# Patient Record
Sex: Female | Born: 1950 | Race: White | Hispanic: No | Marital: Married | Smoking: Never smoker
Health system: Southern US, Community
[De-identification: ages and names within clinical notes are randomized; demographics above are authoritative.]

## PROBLEM LIST (undated history)

## (undated) DIAGNOSIS — I10 Essential (primary) hypertension: Secondary | ICD-10-CM

## (undated) HISTORY — PX: ABDOMINAL HYSTERECTOMY: SHX81

---

## 2020-10-02 ENCOUNTER — Encounter (HOSPITAL_BASED_OUTPATIENT_CLINIC_OR_DEPARTMENT_OTHER): Payer: Self-pay

## 2020-10-02 ENCOUNTER — Emergency Department (HOSPITAL_BASED_OUTPATIENT_CLINIC_OR_DEPARTMENT_OTHER): Payer: Medicare Other

## 2020-10-02 ENCOUNTER — Emergency Department (HOSPITAL_BASED_OUTPATIENT_CLINIC_OR_DEPARTMENT_OTHER)
Admission: EM | Admit: 2020-10-02 | Discharge: 2020-10-02 | Disposition: A | Discharge status: Home / Self Care | Payer: Medicare Other | Attending: Emergency Medicine | Admitting: Emergency Medicine

## 2020-10-02 ENCOUNTER — Other Ambulatory Visit: Payer: Self-pay

## 2020-10-02 DIAGNOSIS — R109 Unspecified abdominal pain: Secondary | ICD-10-CM | POA: Diagnosis present

## 2020-10-02 DIAGNOSIS — I10 Essential (primary) hypertension: Secondary | ICD-10-CM | POA: Insufficient documentation

## 2020-10-02 DIAGNOSIS — K529 Noninfective gastroenteritis and colitis, unspecified: Secondary | ICD-10-CM | POA: Diagnosis not present

## 2020-10-02 DIAGNOSIS — Z20822 Contact with and (suspected) exposure to covid-19: Secondary | ICD-10-CM | POA: Insufficient documentation

## 2020-10-02 DIAGNOSIS — Z79899 Other long term (current) drug therapy: Secondary | ICD-10-CM | POA: Diagnosis not present

## 2020-10-02 HISTORY — DX: Essential (primary) hypertension: I10

## 2020-10-02 LAB — URINALYSIS, ROUTINE W REFLEX MICROSCOPIC
Bilirubin Urine: NEGATIVE
Glucose, UA: NEGATIVE mg/dL
Hgb urine dipstick: NEGATIVE
Ketones, ur: NEGATIVE mg/dL
Leukocytes,Ua: NEGATIVE
Nitrite: NEGATIVE
Protein, ur: NEGATIVE mg/dL
Specific Gravity, Urine: 1.01 (ref 1.005–1.030)
pH: 6 (ref 5.0–8.0)

## 2020-10-02 LAB — CBC
HCT: 38.3 % (ref 36.0–46.0)
Hemoglobin: 12.5 g/dL (ref 12.0–15.0)
MCH: 28.4 pg (ref 26.0–34.0)
MCHC: 32.6 g/dL (ref 30.0–36.0)
MCV: 87 fL (ref 80.0–100.0)
Platelets: 344 10*3/uL (ref 150–400)
RBC: 4.4 MIL/uL (ref 3.87–5.11)
RDW: 13.3 % (ref 11.5–15.5)
WBC: 20.6 10*3/uL — ABNORMAL HIGH (ref 4.0–10.5)
nRBC: 0 % (ref 0.0–0.2)

## 2020-10-02 LAB — COMPREHENSIVE METABOLIC PANEL
ALT: 14 U/L (ref 0–44)
AST: 20 U/L (ref 15–41)
Albumin: 4.4 g/dL (ref 3.5–5.0)
Alkaline Phosphatase: 66 U/L (ref 38–126)
Anion gap: 12 (ref 5–15)
BUN: 14 mg/dL (ref 8–23)
CO2: 24 mmol/L (ref 22–32)
Calcium: 9.3 mg/dL (ref 8.9–10.3)
Chloride: 99 mmol/L (ref 98–111)
Creatinine, Ser: 0.92 mg/dL (ref 0.44–1.00)
GFR, Estimated: >60 mL/min (ref >60)
Glucose, Bld: 123 mg/dL — ABNORMAL HIGH (ref 70–99)
Potassium: 4.1 mmol/L (ref 3.5–5.1)
Sodium: 135 mmol/L (ref 135–145)
Total Bilirubin: 0.6 mg/dL (ref 0.3–1.2)
Total Protein: 8.1 g/dL (ref 6.5–8.1)

## 2020-10-02 LAB — RESP PANEL BY RT-PCR (FLU A&B, COVID) ARPGX2
Influenza A by PCR: NEGATIVE
Influenza B by PCR: NEGATIVE
SARS Coronavirus 2 by RT PCR: NEGATIVE

## 2020-10-02 LAB — LIPASE, BLOOD: Lipase: 22 U/L (ref 11–51)

## 2020-10-02 MED ORDER — ONDANSETRON HCL 4 MG/2ML IJ SOLN
4.0000 mg | Freq: Once | INTRAMUSCULAR | Status: AC
  Administered 2020-10-02: 13:00:00 4 mg via INTRAVENOUS
  Filled 2020-10-02: qty 2

## 2020-10-02 MED ORDER — AMOXICILLIN-POT CLAVULANATE 875-125 MG PO TABS: 1.0000 | ORAL_TABLET | Freq: Two times a day (BID) | ORAL | 0 refills | Status: DC

## 2020-10-02 MED ORDER — SODIUM CHLORIDE 0.9 % IV BOLUS
1000.0000 mL | Freq: Once | INTRAVENOUS | Status: AC
  Administered 2020-10-02: 13:00:00 1000 mL via INTRAVENOUS

## 2020-10-02 MED ORDER — FENTANYL CITRATE (PF) 100 MCG/2ML IJ SOLN
50.0000 ug | Freq: Once | INTRAMUSCULAR | Status: AC
  Administered 2020-10-02: 13:00:00 50 ug via INTRAVENOUS
  Filled 2020-10-02: qty 2

## 2020-10-02 MED ORDER — ONDANSETRON 4 MG PO TBDP: 4.0000 mg | ORAL_TABLET | Freq: Three times a day (TID) | ORAL | 0 refills | Status: AC | PRN

## 2020-10-02 NOTE — ED Triage Notes (Signed)
Pt arrives with c/o pain to RLQ starting yesterday while out shopping did try to eat something last night and vomited, NPO since last night, arrives [facility]> Reports the pain is stabbing, nothing makes it worse or better.

## 2020-10-02 NOTE — Discharge Instructions (Signed)
Your work-up today revealed that you have colitis, which you take the antibiotics as directed.  Please stay hydrated, I will also give you nausea medications that you can take as needed.  Use Tylenol as directed on the bottle.  If you have any new worsening concerning symptoms please come back to the emergency department.  Please follow-up with your primary care in the next couple of days.  Speak to your pharmacist today about any new medications prescribed in regards to side effects or interactions.  Get help right away if you: Have a fever that does not go away with treatment. Develop chills. Have extreme weakness, fainting, or dehydration. Have repeated vomiting. Develop severe pain in your abdomen. Pass bloody or tarry stool.  Your CT findings are below, please go over these with your PCP.   IMPRESSION:  1. Subtle edema surrounding the cecum, suspicious for colitis.  Nonspecific distribution, but most likely infectious.  2. Normal adjacent appendix and terminal ileum.  3.  Possible constipation.  4. Hepatic steatosis and hepatomegaly.  5. Coronary artery atherosclerosis. Aortic Atherosclerosis  (ICD10-I70.0).  6. Left femoral head avascular necrosis, new since 2010

## 2020-10-02 NOTE — ED Notes (Signed)
Pt aware of need for urine specimen, unable to provide at this time. 

## 2020-10-02 NOTE — ED Provider Notes (Signed)
[facility] EMERGENCY DEPARTMENT Provider Note   CSN: [id number] Arrival date & time: 10/02/20  1107     History Chief Complaint  Patient presents with  . Abdominal Pain    Rachel Mitchell is a 69 y.o. female  With pertinent past medical history of hypertension that presents to the emergency department today for right lower quadrant pain that started yesterday around 3 PM.  Patient states that she feels like there is a sharp pain in her right lower quadrant, does not radiate anywhere.  States that she has been feeling nauseous yesterday, vomited this morning.  No hematemesis or bilious emesis.  States that she has not eaten since around 5 PM last night due to her nausea.  States that she was in her normal health before this started.  Denies any fevers, chills.  Denies any abnormal bowel movements.  Is able to pass gas.  States that she did have a bowel movement this morning was normal for her.  No constipation, diarrhea, hematochezia or tarry stools.  Has been vaccinated against Covid.  Denies any abdominal disease, abdominal surgeries include hysterectomy.  Nothing makes it worse or better.  Denies any dysuria or hematuria.  Denies any vaginal complaints.  No other complaints.  HPI     Past Medical History:  Diagnosis Date  . Hypertension     There are no problems to display for this patient.   Past Surgical History:  Procedure Laterality Date  . ABDOMINAL HYSTERECTOMY       OB History   No obstetric history on file.     No family history on file.  Social History   Tobacco Use  . Smoking status: Never Smoker  . Smokeless tobacco: Never Used  Substance Use Topics  . Alcohol use: Never  . Drug use: Never    Home Medications Prior to Admission medications   Medication Sig Start Date End Date Taking? Authorizing Provider  estrogens, conjugated, (PREMARIN) 0.3 MG tablet Take by mouth. 03/06/18  Yes [provider]  zolpidem (AMBIEN) 10 MG tablet Take  by mouth. 09/30/14  Yes [provider]  amoxicillin-clavulanate (AUGMENTIN) 875-125 MG tablet Take 1 tablet by mouth every 12 (twelve) hours. 10/02/20   Farrel Gordon, PA-C  atorvastatin (LIPITOR) 10 MG tablet Take by mouth.    [provider]  gabapentin (NEURONTIN) 300 MG capsule Take by mouth.    [provider]  hydrochlorothiazide (HYDRODIURIL) 25 MG tablet Take by mouth.    [provider]  loratadine (CLARITIN) 10 MG tablet Take by mouth.    [provider]  Multiple Vitamin (MULTI-VITAMIN) tablet Take 1 tablet by mouth daily.    [provider]  olmesartan (BENICAR) 40 MG tablet Take by mouth.    [provider]  omeprazole (PRILOSEC) 40 MG capsule Take by mouth.    [provider]  ondansetron (ZOFRAN ODT) 4 MG disintegrating tablet Take 1 tablet (4 mg total) by mouth every 8 (eight) hours as needed for nausea or vomiting. 10/02/20   Farrel Gordon, PA-C  VERAPAMIL HCL PO Take by mouth.    [provider]    Allergies    Contrast media [iodinated diagnostic agents], Tramadol, Hydrocodone-acetaminophen, Niacin, and Shellfish allergy  Review of Systems   Review of Systems  Constitutional: Negative for chills, diaphoresis, fatigue and fever.  HENT: Negative for congestion, sore throat and trouble swallowing.   Eyes: Negative for pain and visual disturbance.  Respiratory: Negative for cough, shortness of breath  and wheezing.   Cardiovascular: Negative for chest pain, palpitations and leg swelling.  Gastrointestinal: Positive for abdominal pain, nausea and vomiting. Negative for abdominal distention and diarrhea.  Genitourinary: Negative for difficulty urinating.  Musculoskeletal: Negative for back pain, neck pain and neck stiffness.  Skin: Negative for pallor.  Neurological: Negative for dizziness, speech difficulty, weakness and headaches.  Psychiatric/Behavioral: Negative for confusion.    Physical  Exam Updated Vital Signs BP (!) 168/74 (BP Location: Left Arm)   Pulse 81   Temp 98.8 F (37.1 C) (Oral)   Resp 16   Ht 5\' 6"  (1.676 m)   Wt 76.2 kg   SpO2 99%   BMI 27.12 kg/m   Physical Exam Constitutional:      General: She is not in acute distress.    Appearance: Normal appearance. She is not ill-appearing, toxic-appearing or diaphoretic.     Comments: Laying comfortably in bed, however grimacing when patient moves.  HENT:     Mouth/Throat:     Mouth: Mucous membranes are moist.     Pharynx: Oropharynx is clear.  Eyes:     General: No scleral icterus.    Extraocular Movements: Extraocular movements intact.     Pupils: Pupils are equal, round, and reactive to light.  Cardiovascular:     Rate and Rhythm: Normal rate and regular rhythm.     Pulses: Normal pulses.     Heart sounds: Normal heart sounds.  Pulmonary:     Effort: Pulmonary effort is normal. No respiratory distress.     Breath sounds: Normal breath sounds. No stridor. No wheezing, rhonchi or rales.  Chest:     Chest wall: No tenderness.  Abdominal:     General: Abdomen is flat. There is no distension.     Palpations: Abdomen is soft.     Tenderness: There is abdominal tenderness in the right lower quadrant. There is guarding. There is no rebound. Positive signs include McBurney's sign, psoas sign and obturator sign.  Musculoskeletal:        General: No swelling or tenderness. Normal range of motion.     Cervical back: Normal range of motion and neck supple. No rigidity.     Right lower leg: No edema.     Left lower leg: No edema.  Skin:    General: Skin is warm and dry.     Capillary Refill: Capillary refill takes less than 2 seconds.     Coloration: Skin is not pale.  Neurological:     General: No focal deficit present.     Mental Status: She is alert and oriented to person, place, and time.  Psychiatric:        Mood and Affect: Mood normal.        Behavior: Behavior normal.     ED Results /  Procedures / Treatments   Labs (all labs ordered are listed, but only abnormal results are displayed) Labs Reviewed  COMPREHENSIVE METABOLIC PANEL - Abnormal; Notable for the following components:      Result Value   Glucose, Bld 123 (*)    All other components within normal limits  CBC - Abnormal; Notable for the following components:   WBC 20.6 (*)    All other components within normal limits  RESP PANEL BY RT-PCR (FLU A&B, COVID) [id number]  LIPASE, BLOOD  URINALYSIS, ROUTINE W REFLEX MICROSCOPIC    EKG EKG Interpretation  Date/Time:  Friday October 02 2020 11:27:17 EST Ventricular Rate:  87 PR Interval:  148 QRS  Duration: 86 QT Interval:  384 QTC Calculation: 462 R Axis:   10 Text Interpretation: Normal sinus rhythm Normal ECG No old tracing to compare Confirmed by Jacalyn LefevreHaviland, Julie [phone]([id number]) on 10/02/2020 12:06:48 PM   Radiology CT Abdomen Pelvis Wo Contrast  Result Date: 10/02/2020 CLINICAL DATA:  Right lower quadrant pain starting yesterday. Nausea and vomiting. Constipation. Hysterectomy. EXAM: CT ABDOMEN AND PELVIS WITHOUT CONTRAST TECHNIQUE: Multidetector CT imaging of the abdomen and pelvis was performed following the standard protocol without IV contrast. COMPARISON:  06/23/2009 from [facility] FINDINGS: Lower chest: Clear lung bases. Normal heart size without pericardial or pleural effusion. Multivessel coronary artery atherosclerosis. Hepatobiliary: Moderate hepatic steatosis with hepatomegaly at 19.9 cm craniocaudal. Normal gallbladder, without biliary ductal dilatation. Pancreas: Normal, without mass or ductal dilatation. Spleen: Normal in size, without focal abnormality. Adrenals/Urinary Tract: Normal adrenal glands. No renal calculi or hydronephrosis. No hydroureter or ureteric calculi. No bladder calculi. Stomach/Bowel: Gastric underdistention. Colonic stool burden suggests constipation. Normal appendix, including on coronal image 49. Normal terminal ileum on  coronal image 41. Subtle pericolonic edema surrounding the cecum, including on 45/5 and 57/2. No diverticula in this region. Normal small bowel. Vascular/Lymphatic: Aortic atherosclerosis. 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 No abdominopelvic adenopathy. Reproductive: Hysterectomy.  No adnexal mass. Other: Mild pelvic floor laxity.  No free intraperitoneal air. Musculoskeletal: Suspicion of left femoral head avascular necrosis, new since 06/23/2009. No collapse. Lumbosacral spondylosis with trace L4-5 anterolisthesis. IMPRESSION: 1. Subtle edema surrounding the cecum, suspicious for colitis. Nonspecific distribution, but most likely infectious. 2. Normal adjacent appendix and terminal ileum. 3.  Possible constipation. 4. Hepatic steatosis and hepatomegaly. 5. Coronary artery atherosclerosis. Aortic Atherosclerosis (ICD10-I70.0). 6. Left femoral head avascular necrosis, new since 2010 Electronically Signed   By: Jeronimo GreavesKyle  Talbot M.D.   On: 10/02/2020 12:48    Procedures Procedures (including critical care time)  Medications Ordered in ED Medications  sodium chloride 0.9 % bolus 1,000 mL (0 mLs Intravenous Stopped 10/02/20 1337)  fentaNYL (SUBLIMAZE) injection 50 mcg (50 mcg Intravenous Given 10/02/20 1244)  ondansetron (ZOFRAN) injection 4 mg (4 mg Intravenous Given 10/02/20 1242)    ED Course  I have reviewed the triage vital signs and the nursing notes.  Pertinent labs & imaging results that were available during my care of the patient were reviewed by me and considered in my medical decision making (see chart for details).    MDM Rules/Calculators/A&P                          Martin MajesticRuby Mitchell is a 69 y.o. female  With pertinent past medical history of hypertension that presents to the emergency department today for right lower quadrant pain that started yesterday around 3 PM.  Patient with peritoneal signs on exam, concern for appendiceal perforation at this time,  labs and imaging ordered. Initial interventions included IV fluids, Zofran and IV pain medication.  Work-up today with negative urinalysis, CBC with white count of 20.6, CMP unremarkable, lipase unremarkable.  CT scan showing colitis near the cecum which could be explaining patient's symptoms, did consider C diff with patient's white count however patient has not had a single episode of diarrhea.  Will treat colitis at this time.  Upon reassessment patient states that she feels much better, is able to move now with pain medication.  Symptomatic treatment discussed, patient will follow up with PCP.  Patient is slightly hypertensive, most likely due to patient's  pain, patient will follow up with PCP for this.  Incidental findings discussed with patient and put on dispo.  Doubt need for further emergent work up at this time. I explained the diagnosis and have given explicit precautions to return to the ER including for any other new or worsening symptoms. The patient understands and accepts the medical plan as it's been dictated and I have answered their questions. Discharge instructions concerning home care and prescriptions have been given. The patient is STABLE and is discharged to home in good condition.  I discussed this case with my attending physician who cosigned this note including patient's presenting symptoms, physical exam, and planned diagnostics and interventions. Attending physician stated agreement with plan or made changes to plan which were implemented.   Attending physician assessed patient at bedside.  Final Clinical Impression(s) / ED Diagnoses Final diagnoses:  Colitis    Rx / DC Orders ED Discharge Orders         Ordered    amoxicillin-clavulanate (AUGMENTIN) 875-125 MG tablet  Every 12 hours        10/02/20 1409    ondansetron (ZOFRAN ODT) 4 MG disintegrating tablet  Every 8 hours PRN        10/02/20 1411           Farrel Gordon, PA-C 10/02/20 1611    Jacalyn Lefevre, MD 10/12/20 [phone]

## 2020-10-10 ENCOUNTER — Encounter (HOSPITAL_BASED_OUTPATIENT_CLINIC_OR_DEPARTMENT_OTHER): Payer: Self-pay

## 2020-10-10 ENCOUNTER — Other Ambulatory Visit: Payer: Self-pay

## 2020-10-10 ENCOUNTER — Emergency Department (HOSPITAL_BASED_OUTPATIENT_CLINIC_OR_DEPARTMENT_OTHER)
Admission: EM | Admit: 2020-10-10 | Discharge: 2020-10-11 | Disposition: A | Discharge status: Home / Self Care | Payer: Medicare Other | Attending: Emergency Medicine | Admitting: Emergency Medicine

## 2020-10-10 ENCOUNTER — Emergency Department (HOSPITAL_BASED_OUTPATIENT_CLINIC_OR_DEPARTMENT_OTHER): Payer: Medicare Other

## 2020-10-10 DIAGNOSIS — Z20822 Contact with and (suspected) exposure to covid-19: Secondary | ICD-10-CM | POA: Insufficient documentation

## 2020-10-10 DIAGNOSIS — R29818 Other symptoms and signs involving the nervous system: Secondary | ICD-10-CM | POA: Diagnosis not present

## 2020-10-10 DIAGNOSIS — R1031 Right lower quadrant pain: Secondary | ICD-10-CM | POA: Insufficient documentation

## 2020-10-10 DIAGNOSIS — I10 Essential (primary) hypertension: Secondary | ICD-10-CM | POA: Insufficient documentation

## 2020-10-10 DIAGNOSIS — Z91041 Radiographic dye allergy status: Secondary | ICD-10-CM | POA: Insufficient documentation

## 2020-10-10 DIAGNOSIS — R4701 Aphasia: Secondary | ICD-10-CM | POA: Insufficient documentation

## 2020-10-10 DIAGNOSIS — Z79899 Other long term (current) drug therapy: Secondary | ICD-10-CM | POA: Diagnosis not present

## 2020-10-10 DIAGNOSIS — R519 Headache, unspecified: Secondary | ICD-10-CM | POA: Insufficient documentation

## 2020-10-10 DIAGNOSIS — R42 Dizziness and giddiness: Secondary | ICD-10-CM | POA: Insufficient documentation

## 2020-10-10 DIAGNOSIS — R531 Weakness: Secondary | ICD-10-CM | POA: Diagnosis present

## 2020-10-10 DIAGNOSIS — G459 Transient cerebral ischemic attack, unspecified: Secondary | ICD-10-CM

## 2020-10-10 DIAGNOSIS — R479 Unspecified speech disturbances: Secondary | ICD-10-CM | POA: Insufficient documentation

## 2020-10-10 LAB — RAPID URINE DRUG SCREEN, HOSP PERFORMED
Amphetamines: NOT DETECTED
Barbiturates: NOT DETECTED
Benzodiazepines: NOT DETECTED
Cocaine: NOT DETECTED
Opiates: NOT DETECTED
Tetrahydrocannabinol: NOT DETECTED

## 2020-10-10 LAB — CBC
HCT: 36.2 % (ref 36.0–46.0)
Hemoglobin: 11.9 g/dL — ABNORMAL LOW (ref 12.0–15.0)
MCH: 28.7 pg (ref 26.0–34.0)
MCHC: 32.9 g/dL (ref 30.0–36.0)
MCV: 87.2 fL (ref 80.0–100.0)
Platelets: 410 10*3/uL — ABNORMAL HIGH (ref 150–400)
RBC: 4.15 MIL/uL (ref 3.87–5.11)
RDW: 13.7 % (ref 11.5–15.5)
WBC: 17.1 10*3/uL — ABNORMAL HIGH (ref 4.0–10.5)
nRBC: 0 % (ref 0.0–0.2)

## 2020-10-10 LAB — CBC WITH DIFFERENTIAL/PLATELET
Abs Immature Granulocytes: 0.05 10*3/uL (ref 0.00–0.07)
Basophils Absolute: 0 10*3/uL (ref 0.0–0.1)
Basophils Relative: 0 %
Eosinophils Absolute: 0.1 10*3/uL (ref 0.0–0.5)
Eosinophils Relative: 1 %
HCT: 36 % (ref 36.0–46.0)
Hemoglobin: 12 g/dL (ref 12.0–15.0)
Immature Granulocytes: 0 %
Lymphocytes Relative: 11 %
Lymphs Abs: 1.7 10*3/uL (ref 0.7–4.0)
MCH: 28.6 pg (ref 26.0–34.0)
MCHC: 33.3 g/dL (ref 30.0–36.0)
MCV: 85.9 fL (ref 80.0–100.0)
Monocytes Absolute: 1.3 10*3/uL — ABNORMAL HIGH (ref 0.1–1.0)
Monocytes Relative: 9 %
Neutro Abs: 11.4 10*3/uL — ABNORMAL HIGH (ref 1.7–7.7)
Neutrophils Relative %: 79 %
Platelets: 379 10*3/uL (ref 150–400)
RBC: 4.19 MIL/uL (ref 3.87–5.11)
RDW: 13.6 % (ref 11.5–15.5)
WBC: 14.6 10*3/uL — ABNORMAL HIGH (ref 4.0–10.5)
nRBC: 0 % (ref 0.0–0.2)

## 2020-10-10 LAB — COMPREHENSIVE METABOLIC PANEL
ALT: 10 U/L (ref 0–44)
AST: 14 U/L — ABNORMAL LOW (ref 15–41)
Albumin: 3.8 g/dL (ref 3.5–5.0)
Alkaline Phosphatase: 61 U/L (ref 38–126)
Anion gap: 12 (ref 5–15)
BUN: 22 mg/dL (ref 8–23)
CO2: 20 mmol/L — ABNORMAL LOW (ref 22–32)
Calcium: 9.3 mg/dL (ref 8.9–10.3)
Chloride: 100 mmol/L (ref 98–111)
Creatinine, Ser: 1.45 mg/dL — ABNORMAL HIGH (ref 0.44–1.00)
GFR, Estimated: 39 mL/min — ABNORMAL LOW (ref >60)
Glucose, Bld: 102 mg/dL — ABNORMAL HIGH (ref 70–99)
Potassium: 5 mmol/L (ref 3.5–5.1)
Sodium: 132 mmol/L — ABNORMAL LOW (ref 135–145)
Total Bilirubin: 0.5 mg/dL (ref 0.3–1.2)
Total Protein: 7.7 g/dL (ref 6.5–8.1)

## 2020-10-10 LAB — URINALYSIS, ROUTINE W REFLEX MICROSCOPIC
Bilirubin Urine: NEGATIVE
Glucose, UA: NEGATIVE mg/dL
Hgb urine dipstick: NEGATIVE
Ketones, ur: NEGATIVE mg/dL
Leukocytes,Ua: NEGATIVE
Nitrite: NEGATIVE
Protein, ur: NEGATIVE mg/dL
Specific Gravity, Urine: 1.005 (ref 1.005–1.030)
pH: 5 (ref 5.0–8.0)

## 2020-10-10 LAB — ETHANOL: Alcohol, Ethyl (B): <10 mg/dL (ref <10)

## 2020-10-10 LAB — APTT: aPTT: 35 seconds (ref 24–36)

## 2020-10-10 LAB — PROTIME-INR
INR: 1.1 (ref 0.8–1.2)
Prothrombin Time: 13.6 seconds (ref 11.4–15.2)

## 2020-10-10 NOTE — ED Provider Notes (Signed)
[facility] EMERGENCY DEPARTMENT Provider Note   CSN: [id number] Arrival date & time: 10/10/20  1128     History Chief Complaint  Patient presents with  . Weakness    Rachel Mitchell is a 70 y.o. female.  HPI Patient presents with difficulty walking and headache.  States at 430 this morning she woke up with a mild headache at the base of her skull.  States then it became severe.  Has since improved.  States she was weak all over and was unable to walk.  States she was feeling weak everywhere not lateralizing left or right.  States she also had some difficulty speaking.  States the words that were coming out were not right.  Headache improved some.  States she basically sat around from 430 to around 1130.  Since then still has a dull headache.  However states she cannot walk well.  States she feels that she can fall forward or backward.  States she feels drunk.  The speech is cleared off however.  Has not episodes like this before.  Thinks she may have had a stroke.  Recently diagnosed with colitis.  States she has not eaten much over the last few weeks because of it.  Continued lower abdominal pain.  Reportedly has an allergy to contrast dye.  States she got around 10 years ago.  Does not know exactly what the reaction was although states it came on quickly.  Reviewed notes it appears if it was rash and swelling.    Past Medical History:  Diagnosis Date  . Hypertension     There are no problems to display for this patient.   Past Surgical History:  Procedure Laterality Date  . ABDOMINAL HYSTERECTOMY       OB History   No obstetric history on file.     History reviewed. No pertinent family history.  Social History   Tobacco Use  . Smoking status: Never Smoker  . Smokeless tobacco: Never Used  Substance Use Topics  . Alcohol use: Never  . Drug use: Never    Home Medications Prior to Admission medications   Medication Sig Start Date End Date Taking? Authorizing  Provider  amoxicillin-clavulanate (AUGMENTIN) 875-125 MG tablet Take 1 tablet by mouth every 12 (twelve) hours. 10/02/20   Farrel Gordon, PA-C  atorvastatin (LIPITOR) 10 MG tablet Take by mouth.    [provider]  estrogens, conjugated, (PREMARIN) 0.3 MG tablet Take by mouth. 03/06/18   [provider]  gabapentin (NEURONTIN) 300 MG capsule Take by mouth.    [provider]  hydrochlorothiazide (HYDRODIURIL) 25 MG tablet Take by mouth.    [provider]  loratadine (CLARITIN) 10 MG tablet Take by mouth.    [provider]  Multiple Vitamin (MULTI-VITAMIN) tablet Take 1 tablet by mouth daily.    [provider]  olmesartan (BENICAR) 40 MG tablet Take by mouth.    [provider]  omeprazole (PRILOSEC) 40 MG capsule Take by mouth.    [provider]  ondansetron (ZOFRAN ODT) 4 MG disintegrating tablet Take 1 tablet (4 mg total) by mouth every 8 (eight) hours as needed for nausea or vomiting. 10/02/20   Farrel Gordon, PA-C  VERAPAMIL HCL PO Take by mouth.    [provider]  zolpidem (AMBIEN) 10 MG tablet Take by mouth. 09/30/14   [provider]    Allergies    Contrast media [iodinated diagnostic agents], Tramadol, Hydrocodone-acetaminophen, Niacin, and Shellfish allergy  Review of  Systems   Review of Systems  Constitutional: Negative for appetite change.  HENT: Negative for dental problem.   Respiratory: Negative for shortness of breath.   Cardiovascular: Negative for chest pain.  Gastrointestinal: Positive for abdominal pain and diarrhea.  Genitourinary: Negative for flank pain.  Musculoskeletal: Negative for back pain.  Neurological: Positive for speech difficulty, weakness and headaches.  Psychiatric/Behavioral: Negative for confusion.    Physical Exam Updated Vital Signs BP 118/76 (BP Location: Right Arm)   Pulse 87   Temp 98.5 F (36.9 C) (Oral)   Resp 18   Ht 5\' 6"  (1.676 m)   Wt  65.8 kg   SpO2 100%   BMI 23.40 kg/m   Physical Exam Vitals and nursing note reviewed.  Constitutional:      Appearance: Normal appearance.  HENT:     Head: Normocephalic.     Right Ear: Ear canal normal.     Left Ear: Ear canal normal.     Mouth/Throat:     Mouth: Mucous membranes are moist.  Eyes:     Pupils: Pupils are equal, round, and reactive to light.     Comments: Some nystagmus with gaze to right.  Otherwise conjugate gaze.  Cardiovascular:     Rate and Rhythm: Normal rate and regular rhythm.  Pulmonary:     Breath sounds: No wheezing or rhonchi.  Abdominal:     Tenderness: There is abdominal tenderness.     Comments: Right lower abdominal tenderness.  Musculoskeletal:        General: No tenderness.  Skin:    General: Skin is warm.  Neurological:     Mental Status: She is alert and oriented to person, place, and time.     Comments: Face symmetric.  Eye movements intact but does have some nystagmus particular with gaze to right.  Good grip strength bilaterally.  Finger-nose and heel shin intact bilaterally.  Normal speech.  However with standing patient is unsteady particularly with forward and backward direction.  Strength appears intact in upper and lower extremities.     ED Results / Procedures / Treatments   Labs (all labs ordered are listed, but only abnormal results are displayed) Labs Reviewed  CBC - Abnormal; Notable for the following components:      Result Value   WBC 17.1 (*)    Hemoglobin 11.9 (*)    Platelets 410 (*)    All other components within normal limits  COMPREHENSIVE METABOLIC PANEL - Abnormal; Notable for the following components:   Sodium 132 (*)    CO2 20 (*)    Glucose, Bld 102 (*)    Creatinine, Ser 1.45 (*)    AST 14 (*)    GFR, Estimated 39 (*)    All other components within normal limits  CBC WITH DIFFERENTIAL/PLATELET - Abnormal; Notable for the following components:   WBC 14.6 (*)    Neutro Abs 11.4 (*)    Monocytes  Absolute 1.3 (*)    All other components within normal limits  SARS CORONAVIRUS 2 (TAT 6-24 HRS)  ETHANOL  PROTIME-INR  APTT  RAPID URINE DRUG SCREEN, HOSP PERFORMED  URINALYSIS, ROUTINE W REFLEX MICROSCOPIC    EKG EKG Interpretation  Date/Time:  Saturday October 10 2020 11:39:59 EST Ventricular Rate:  86 PR Interval:  190 QRS Duration: 86 QT Interval:  374 QTC Calculation: 447 R Axis:   -3 Text Interpretation: Normal sinus rhythm Normal ECG No significant change since last tracing Confirmed by Aletta Edouard [phone]) on  10/10/2020 11:54:55 AM   Radiology CT Head Wo Contrast  Result Date: 10/10/2020 CLINICAL DATA:  Posterior headache EXAM: CT HEAD WITHOUT CONTRAST TECHNIQUE: Contiguous axial images were obtained from the base of the skull through the vertex without intravenous contrast. COMPARISON:  None. FINDINGS: Brain: No evidence of acute territorial infarction, hemorrhage, hydrocephalus,extra-axial collection or mass lesion/mass effect. There is dilatation the ventricles and sulci consistent with age-related atrophy. Low-attenuation changes in the deep white matter consistent with small vessel ischemia. Vascular: No hyperdense vessel or unexpected calcification. Skull: The skull is intact. No fracture or focal lesion identified. Sinuses/Orbits: The visualized paranasal sinuses and mastoid air cells are clear. The orbits and globes intact. Other: None IMPRESSION: No acute intracranial abnormality. Electronically Signed   By: Jonna Clark M.D.   On: 10/10/2020 20:44    Procedures Procedures (including critical care time)  Medications Ordered in ED Medications - No data to display  ED Course  I have reviewed the triage vital signs and the nursing notes.  Pertinent labs & imaging results that were available during my care of the patient were reviewed by me and considered in my medical decision making (see chart for details).    MDM Rules/Calculators/A&P                           Patient presents with headache and then difficulty speaking/unsteadiness.  Last normal was when she went to bed last night but woke at 4:30 AM with the symptoms.  Does have some unsteadiness with standing although voice has improved and is back at her baseline for that.  Some nystagmus also.  Initial head CT reassuring.  Not a TPA candidate due to time of onset.  Not an LVO positive patient.  However think patient needs stroke rule out and admission to the hospital.  Patient has an IV contrast allergy so cannot get a quick CT angiography.  Will admit to hospital and will need neurologic consult.   Final Clinical Impression(s) / ED Diagnoses Final diagnoses:  Dizziness    Rx / DC Orders ED Discharge Orders    None       Benjiman Core, MD 10/10/20 2142

## 2020-10-10 NOTE — ED Notes (Signed)
teleneuro in progress 

## 2020-10-10 NOTE — ED Notes (Signed)
ED Provider at bedside. 

## 2020-10-10 NOTE — ED Triage Notes (Signed)
Pt states awoke 4:30 am with posterior headache, states, "I think I had a small stroke earlier"  States not feeling as if she is speaking correctly.  Answers all questions upon arrival appropriately.  Moves all extremities equally, reports sense of generalized weakness.  Headache improving.  Able to eat breakfast, drink water.

## 2020-10-10 NOTE — Consult Note (Signed)
   [facility] Tele[facility]urology Consult Services  Stat Consult  Date of Service:   10/10/2020 22:14:18  Diagnosis:     .  R51.9 - Headache, unspecified  Impression: Patient presented with transient aphasia associated with headaches. Currently no aphasia. Head CT showed no acute process. No thrombolytics due to lack of disabling symptoms and outside of treatment time window.  CT HEAD: Showed No Acute Hemorrhage or Acute Core Infarct  Our recommendations are outlined below.  Diagnostic Studies: Recommend MRI brain without contrast Routine MRA head without contrast and MRA Neck with contrast Transthoracic Echo with bubble study, if available  Laboratory Studies: Recommend Lipid panel Hemoglobin A1c  Medication: Initiate Aspirin 81 mg daily Statins for LDL goal less than 70  Nursing Recommendations: Telemetry, IV Fluids, avoid dextrose containing fluids, Maintain euglycemia Neuro checks q4 hrs x 24 hrs and then per shift Head of bed 30 degrees  Consultations: Recommend Speech therapy if failed dysphagia screen Physical therapy/Occupational therapy  DVT Prophylaxis: SCDs, Pneumatic Compression Lovenox or LMW Heparin  Disposition: Neurology will follow  Additional Recommendations:     Metrics: [facility] Notification Time: 10/10/2020 22:12:52 Stamp Time: 10/10/2020 22:14:18 Callback Response Time: 10/10/2020 22:16:33   ----------------------------------------------------------------------------------------------------  Chief Complaint: Headache  History of Present Illness: Patient is a 70 year old Female.  Past medical history of hypertension presented with headache. History was provided by the patient at bedside. While waking up to use the bathroom started to have a bad headache on the back of the head. It all started around 0430. last nigh had transient aphasia. Used to have headaches long time since 7 months.    Past Medical History:     .  Hypertension     . Hyperlipidemia    Antiplatelet use: No    Examination: BP(127/64), Pulse(83), Blood Glucose(102) 1A: Level of Consciousness - Alert; keenly responsive + 0 1B: Ask Month and Age - Both Questions Right + 0 1C: Blink Eyes & Squeeze Hands - Performs Both Tasks + 0 2: Test Horizontal Extraocular Movements - Normal + 0 3: Test Visual Fields - No Visual Loss + 0 4: Test Facial Palsy (Use Grimace if Obtunded) - Normal symmetry + 0 5A: Test Left Arm Motor Drift - No Drift for 10 Seconds + 0 5B: Test Right Arm Motor Drift - No Drift for 10 Seconds + 0 6A: Test Left Leg Motor Drift - No Drift for 5 Seconds + 0 6B: Test Right Leg Motor Drift - No Drift for 5 Seconds + 0 7: Test Limb Ataxia (FNF/Heel-Shin) - No Ataxia + 0 8: Test Sensation - Normal; No sensory loss + 0 9: Test Language/Aphasia - Normal; No aphasia + 0 10: Test Dysarthria - Normal + 0 11: Test Extinction/Inattention - No abnormality + 0  NIHSS Score: 0   Patient / Family was informed the Neurology Consult would occur via TeleHealth consult by way of interactive audio and video telecommunications and consented to receiving care in this manner.  Patient is being evaluated for possible acute neurologic impairment and high probability of imminent or life - threatening deterioration.I spent total of 15 minutes providing care to this patient, including time for face to face visit via telemedicine, review of medical records, imaging studies and discussion of findings with providers, the patient and / or family.   Dr Letta Kocher Ollis Daudelin   [facility] [phone]  Case [id number]

## 2020-10-11 ENCOUNTER — Emergency Department (HOSPITAL_BASED_OUTPATIENT_CLINIC_OR_DEPARTMENT_OTHER): Payer: Medicare Other

## 2020-10-11 DIAGNOSIS — R42 Dizziness and giddiness: Secondary | ICD-10-CM | POA: Diagnosis not present

## 2020-10-11 LAB — SARS CORONAVIRUS 2 (TAT 6-24 HRS): SARS Coronavirus 2: NEGATIVE

## 2020-10-11 MED ORDER — IRBESARTAN 150 MG PO TABS
300.0000 mg | ORAL_TABLET | Freq: Every day | ORAL | Status: DC
  Administered 2020-10-11: 300 mg via ORAL

## 2020-10-11 MED ORDER — CLONAZEPAM 1 MG PO TABS
1.0000 mg | ORAL_TABLET | Freq: Two times a day (BID) | ORAL | Status: DC | PRN
  Filled 2020-10-11: qty 1

## 2020-10-11 MED ORDER — VERAPAMIL HCL ER 180 MG PO TBCR
180.0000 mg | EXTENDED_RELEASE_TABLET | Freq: Two times a day (BID) | ORAL | Status: DC
  Administered 2020-10-11: 180 mg via ORAL
  Filled 2020-10-11 (×3): qty 1

## 2020-10-11 MED ORDER — METOPROLOL TARTRATE 25 MG PO TABS
25.0000 mg | ORAL_TABLET | Freq: Every morning | ORAL | Status: DC
  Administered 2020-10-11: 25 mg via ORAL
  Filled 2020-10-11: qty 1

## 2020-10-11 MED ORDER — PRAVASTATIN SODIUM 40 MG PO TABS
40.0000 mg | ORAL_TABLET | Freq: Every day | ORAL | Status: DC
  Filled 2020-10-11: qty 1

## 2020-10-11 MED ORDER — VERAPAMIL HCL 40 MG PO TABS
40.0000 mg | ORAL_TABLET | Freq: Three times a day (TID) | ORAL | Status: DC
  Filled 2020-10-11: qty 1

## 2020-10-11 MED ORDER — HYDROCHLOROTHIAZIDE 25 MG PO TABS
25.0000 mg | ORAL_TABLET | Freq: Every day | ORAL | Status: DC
  Filled 2020-10-11: qty 1

## 2020-10-11 MED ORDER — ATORVASTATIN CALCIUM 10 MG PO TABS
10.0000 mg | ORAL_TABLET | Freq: Every day | ORAL | Status: DC
  Filled 2020-10-11: qty 1

## 2020-10-11 MED ORDER — ASPIRIN EC 81 MG PO TBEC
81.0000 mg | DELAYED_RELEASE_TABLET | Freq: Once | ORAL | Status: AC
  Administered 2020-10-11: 81 mg via ORAL
  Filled 2020-10-11: qty 1

## 2020-10-11 MED ORDER — GABAPENTIN 100 MG PO CAPS: 100.0000 mg | ORAL_CAPSULE | Freq: Three times a day (TID) | ORAL | Status: DC

## 2020-10-11 MED ORDER — PANTOPRAZOLE SODIUM 40 MG PO TBEC
40.0000 mg | DELAYED_RELEASE_TABLET | Freq: Every day | ORAL | Status: DC
  Administered 2020-10-11: 40 mg via ORAL

## 2020-10-11 MED ORDER — DULOXETINE HCL 30 MG PO CPEP
30.0000 mg | ORAL_CAPSULE | Freq: Every day | ORAL | Status: DC
  Administered 2020-10-11: 30 mg via ORAL
  Filled 2020-10-11 (×3): qty 1

## 2020-10-11 MED ORDER — PANTOPRAZOLE SODIUM 40 MG PO TBEC
40.0000 mg | DELAYED_RELEASE_TABLET | Freq: Every day | ORAL | Status: DC
  Filled 2020-10-11: qty 1

## 2020-10-11 MED ORDER — IRBESARTAN 150 MG PO TABS
300.0000 mg | ORAL_TABLET | Freq: Every day | ORAL | Status: DC
  Filled 2020-10-11: qty 2

## 2020-10-11 MED ORDER — ZOLPIDEM TARTRATE 5 MG PO TABS
5.0000 mg | ORAL_TABLET | Freq: Every day | ORAL | Status: DC
  Filled 2020-10-11: qty 1

## 2020-10-11 MED ORDER — GADOBUTROL 1 MMOL/ML IV SOLN
7.0000 mL | Freq: Once | INTRAVENOUS | Status: AC | PRN
  Administered 2020-10-11: 7 mL via INTRAVENOUS

## 2020-10-11 NOTE — ED Provider Notes (Signed)
  Physical Exam  BP (!) 143/69 (BP Location: Left Arm)   Pulse 94   Temp 98.1 F (36.7 C) (Oral)   Resp 18   Ht 5\' 6"  (1.676 m)   Wt 65.8 kg   SpO2 100%   BMI 23.40 kg/m   Physical Exam  ED Course/Procedures     Procedures  MDM  Patient care assumed at 3 PM.  Patient had headache and trouble speaking 2 days ago.  Patient was admitted and she has been in the ED for 24 hours now. She had an MRI done in the ED that showed no stroke and MRA head and neck that was unremarkable.  Patient wants to go home if possible.  I reconsulted neurology and discussed case with Dr. from neurology.  He reviewed the records and states that it is reasonable that patient goes home and can get outpatient work-up for TIA.  Especially since there are no beds in the hospital and patient will likely be waiting here for longer.  He does recommend aspirin 81 mg daily.      Otelia Limes, MD 10/11/20 [phone]

## 2020-10-11 NOTE — ED Notes (Signed)
ED Provider at bedside. 

## 2020-10-11 NOTE — ED Notes (Signed)
[facility] to send over Calan SR and Cymbalta

## 2020-10-11 NOTE — ED Notes (Addendum)
Declines food or fluids, states," I have colonitis and I don't want to eat anything " Denies abd pain.Ambulated to BR, gait steady, denies dizziness

## 2020-10-11 NOTE — ED Notes (Signed)
Asking if she could be discharged home and follow up with her DR. Has a appointment tomorrow. ED MD informed, will speak to her

## 2020-10-11 NOTE — ED Notes (Signed)
Patient transported to MRI 

## 2020-10-11 NOTE — Discharge Instructions (Signed)
Take Aspirin 81 mg daily   You may have a TIA which is a mini stroke   See neurology for follow up   Return to ER if you have worse dizziness, headaches, trouble speaking, numbness, weakness

## 2021-03-18 IMAGING — MR MR MRA NECK W/O CM
5 series · 26 of 48 positions shown · non-contrast
Comparison: None.

CLINICAL DATA: Neuro deficit.  Occipital headache.

EXAM:
MRA NECK WITHOUT CONTRAST
TECHNIQUE: Angiographic images of the neck were obtained using MRA technique
without intravenous contrast. Carotid stenosis measurements (when
applicable) are obtained utilizing NASCET criteria, using the distal
internal carotid diameter as the denominator.

[Series 4: fl_tof_2d · axial · 3.0mm · 0.49mm/px · z∈[-130,+19]mm · 12 of 82 slices shown]
[im 1/82]
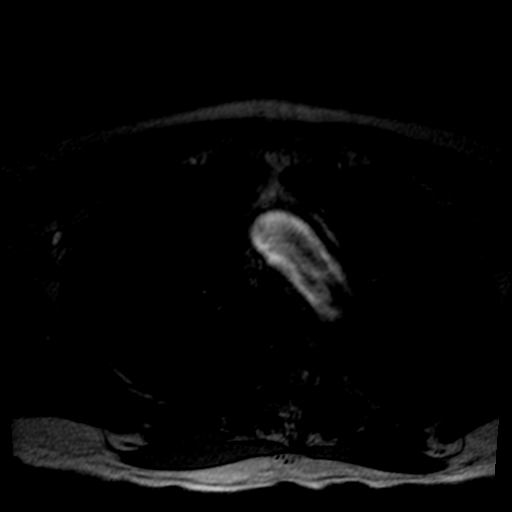
[im 4/82]
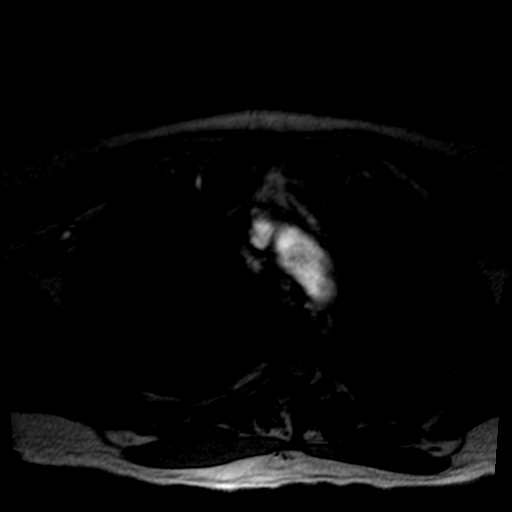
[im 12/82]
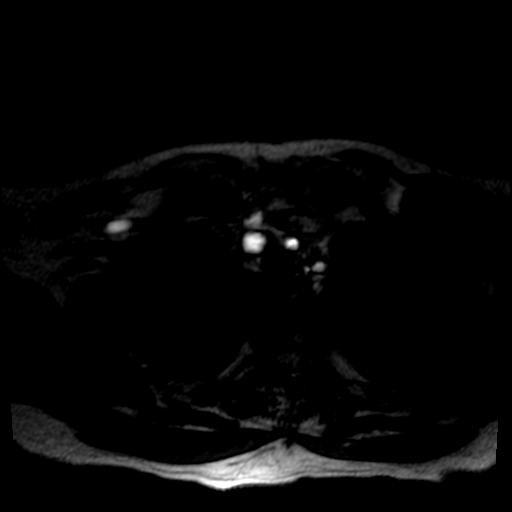
[im 16/82]
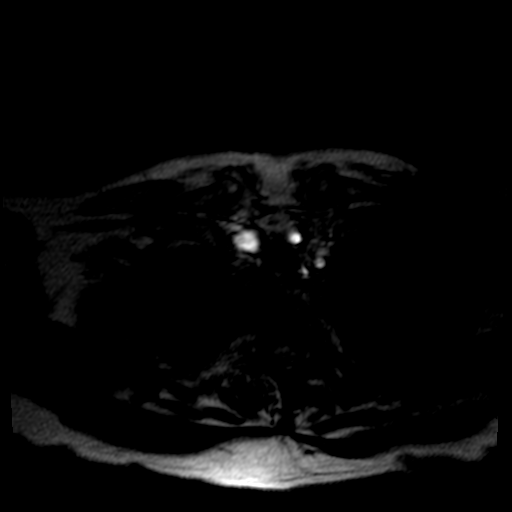
[im 24/82]
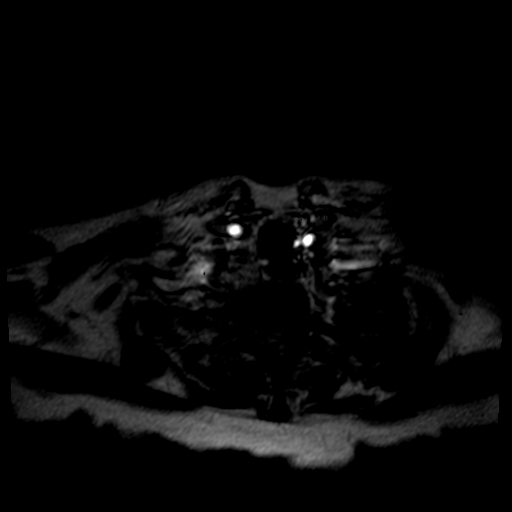
[im 35/82]
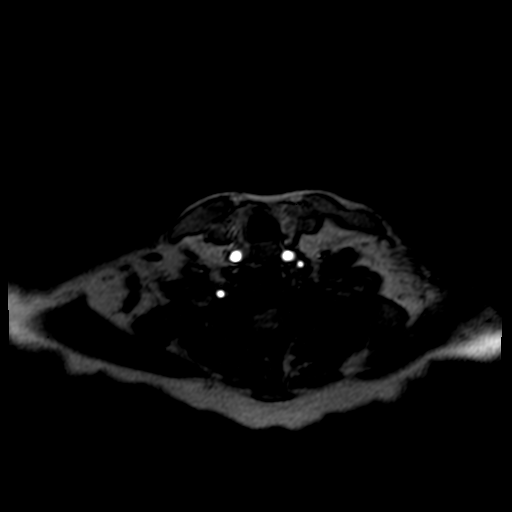
[im 43/82]
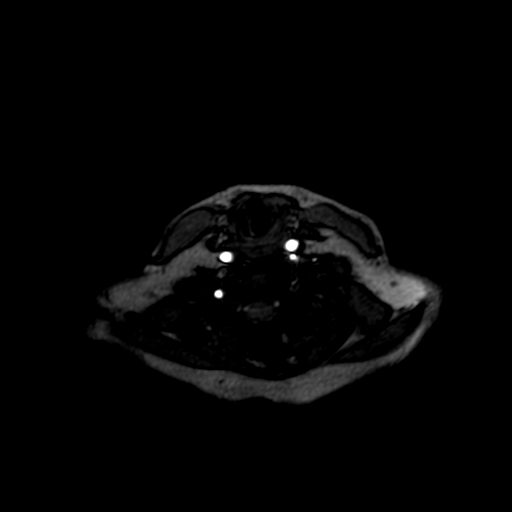
[im 47/82]
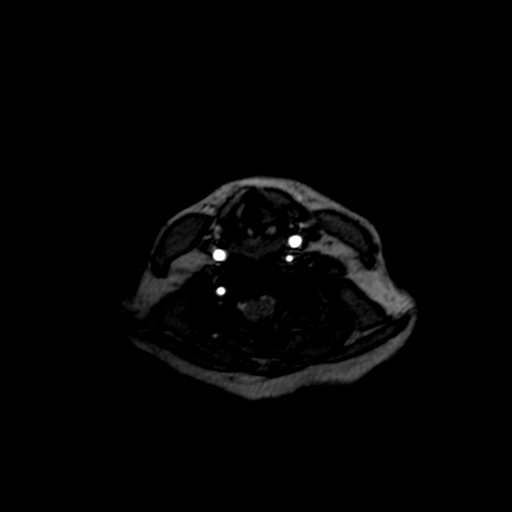
[im 58/82]
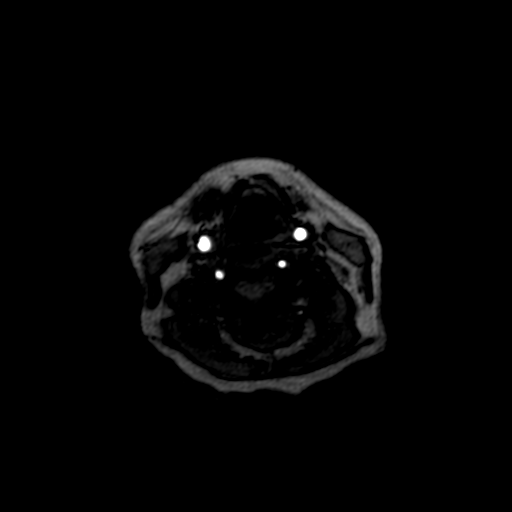
[im 66/82]
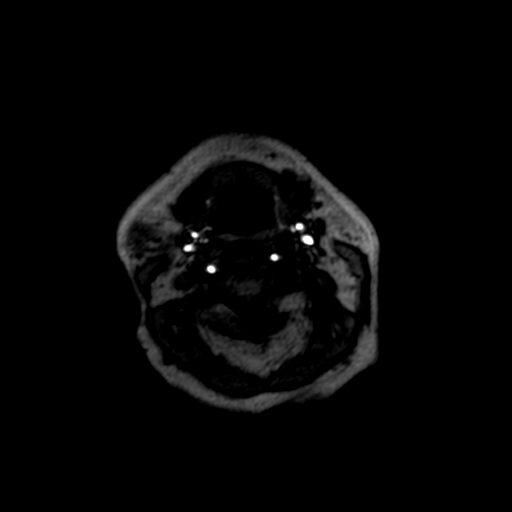
[im 70/82]
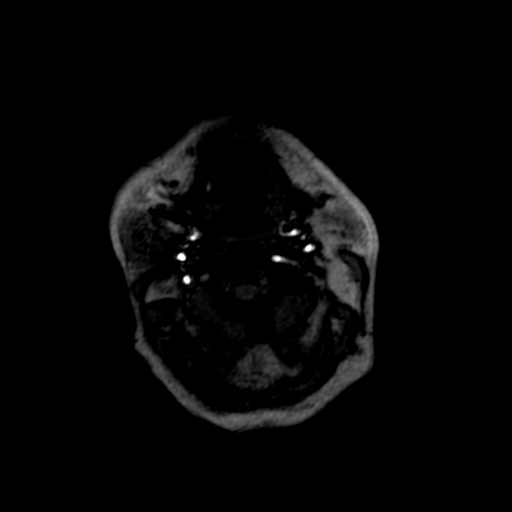
[im 78/82]
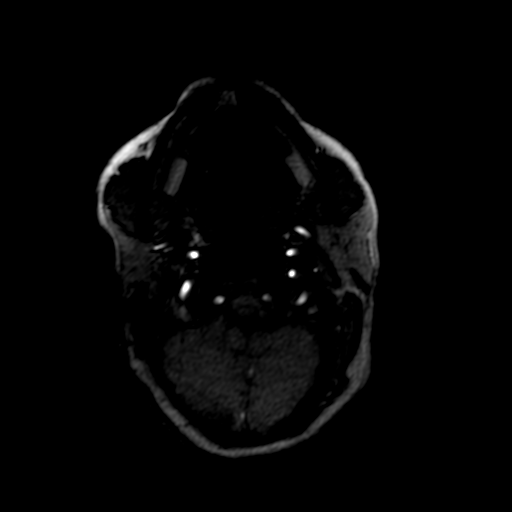

[Series 7: fl_tof_2d_mip_tra · axial · 165.8mm · 0.49mm/px · 1 of 1 slices shown]
[im 1/1]
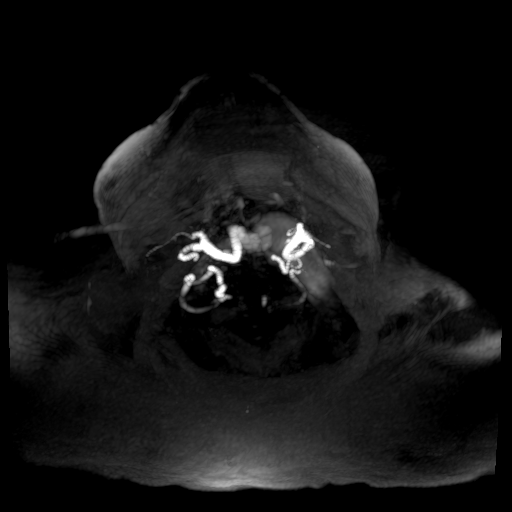

[Series 8: (id)_tt=1.0s · coronal · 0.8mm · 0.78mm/px · 11 of 88 slices shown]
[im 4/88]
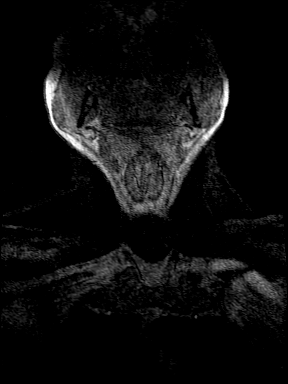
[im 12/88]
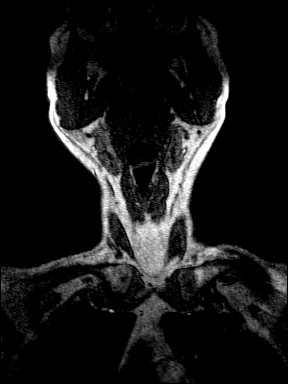
[im 16/88]
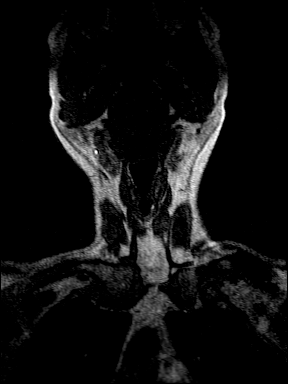
[im 28/88]
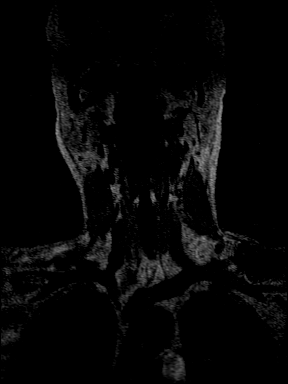
[im 40/88]
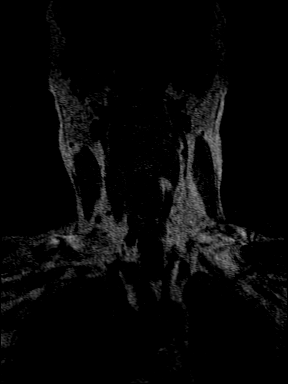
[im 44/88]
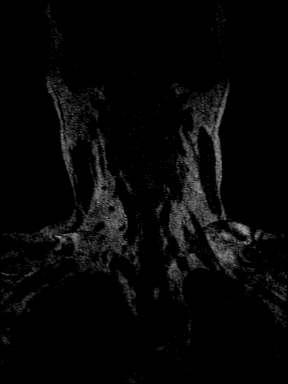
[im 48/88]
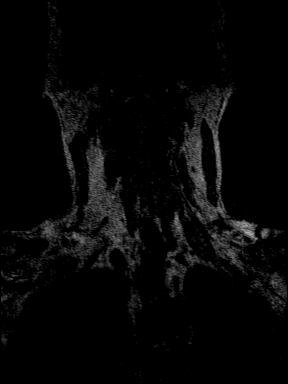
[im 60/88]
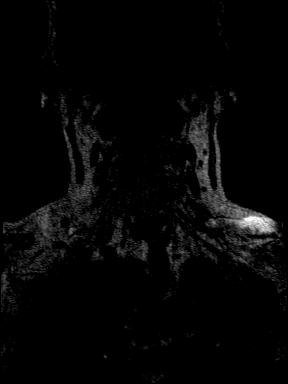
[im 72/88]
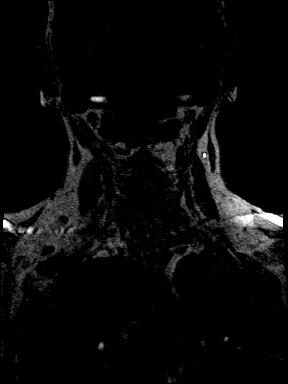
[im 76/88]
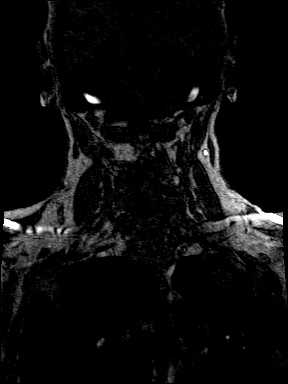
[im 84/88]
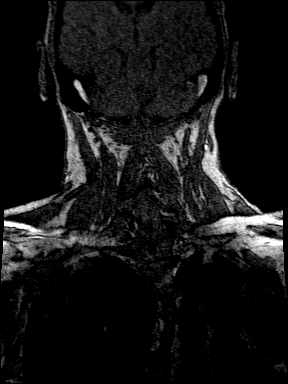

[Series 100: MRA · axial · 0.52mm/px · 1 of 1 slices shown (1 of 2)]
[im 1/1]
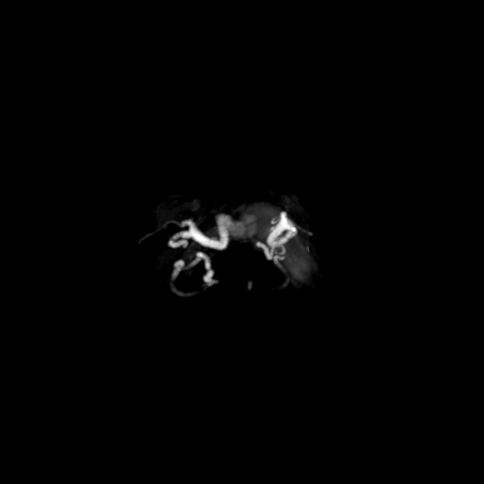

[Series 102: MRA · axial · 0.52mm/px · 1 of 1 slices shown (2 of 2)]
[im 1/1]
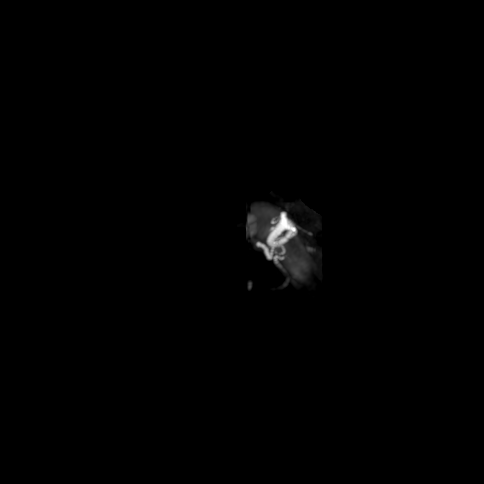

[26 of 48 positions shown; findings below may reference images not displayed]

FINDINGS: MRA NECK FINDINGS

Antegrade flow in the carotid and vertebral arteries bilaterally.
Left vertebral artery origin from the aortic arch, a normal variant.

Carotid bifurcation patent bilaterally without stenosis. Both
vertebral arteries patent without stenosis.
IMPRESSION: Negative MRA head without contrast.

## 2024-06-08 NOTE — Discharge Summary (Signed)
 Cardiothoracic Surgery Discharge Summary Sat 06/08/2024   Patient:  Rachel Mitchell  MRN: [id number] DOB: [birth year] Age: 73 y.o.  AdmittingAttending: Dallas VEAR Rana, MD  Admission Date: 06/03/2024 Admission Diagnosis: Atherosclerosis of native coronary artery of native heart with angina pectoris [I25.119] CAD in native artery [I25.10]  Discharge Service: Thoracic Surgery Discharge Attending: Dallas Denise Rana, MD Discharge Date: June 08, 2024 Discharge Condition:  stable  Hospital Course: On 06/03/2024, Rachel Mitchell was taken to the OR for the following procedure: CABG x 2. LIMA to the mid LAD, saphenous vein graft to the first obtuse marginal via sternotomy.  Overall postoperatively she did well.  She was extubated on day of surgery and by her first postoperative day she was off all vasopressors and inotropic support.  Transfer orders were placed on her first postoperative day to transfer from the intensive care unit aggressive care unit.  She is seen by physical therapy and ambulating the hallways.  Her chest tubes removed on the second postoperative day and her pacemaker wires removed on her third postoperative day.  She has started on a beta-blocker statin and aspirin .  She is making good progress then on the evening of her fourth postoperative day she had an episode of atrial fibrillation/SVT and a rapid response was called.  She did a vagal maneuver and her rhythm broke.  She then remained in normal sinus rhythm.  She was not started on amiodarone drip.  Over the next 36 hours she remained in normal sinus rhythm.  She had acute blood loss anemia with her hemoglobin drifting down to less than 8 but then with diuresis it increased to 8.1 at the time of discharge.  She will take iron for 6 months.  She has prediabetes and was on a sliding scale getting small doses (1 unit) of insulin 1-2 times per day.  She was diuresed and responded well and her intake and output were net negative at  discharge.  Her wounds are healing nicely with no evidence of infection.  She remained afebrile throughout her hospitalization.  By her fifth postoperative day she was in stable condition and ready for discharge.  We discharged the patient in stable condition   Physical Exam: Vital Signs: Temp:  [97.6 F (36.4 C)-98.9 F (37.2 C)] 98.4 F (36.9 C) Heart Rate:  [68-84] 74 Resp:  [16-20] 18 BP: (109-135)/(40-104) 135/54  Current Weight: 74.3 kg (163 lb 11.2 oz)  Admission Weight: Weight: 72.9 kg (160 lb 12.8 oz)  General: No apparent distress. Alert,oriented X 4.  Cardiovascular: Regular rate and rhythm, s1, s2, no murmurs.  Chest/Lungs: Clear to auscultation bilaterally, no wheezes or rhonchi.  Abdomen: Positive bowel sounds. Abdomen soft, nondistended, nontender. NoHSM, rebound or guarding.  : 2+ peripheral pulses.  No clubbing, cyanosis. No edema.   Skin / Incision / Wound: Sternal incision is C/D/I, chest tube site C/D/I SVG incision C/D/I       Labs:  Results from last 7 days  Lab Units 06/08/24 0440 06/07/24 1212 06/07/24 0418  WHITE BLOOD CELL COUNT 10*3/uL 6.41 7.11 8.06  HEMOGLOBIN g/dL 8.3* 7.9* 7.8*  HEMATOCRIT % 24.7* 23.4* 22.8*  PLATELET COUNT 10*3/uL 270 245 223   Results from last 7 days  Lab Units 06/08/24 0440 06/07/24 0418 06/06/24 0141  SODIUM mmol/L 137 135* 136  POTASSIUM mmol/L 3.6 3.8 3.8  CHLORIDE mmol/L 101 99 104  CO2 mmol/L 29 28 25   BUN mg/dL 13 13 15   CREATININE mg/dL 9.23 9.16 9.22  GLUCOSE  mg/dL 876* 876* 876*  CALCIUM  mg/dL 8.4* 8.4* 7.9*   Results from last 7 days  Lab Units 06/08/24 0440 06/07/24 0418 06/06/24 0141  MAGNESIUM mg/dL 2.7 1.7* 1.9    Results from last 7 days  Lab Units 06/03/24 1731 06/03/24 1518  INR  1.5 1.6*   No results for input(s): HGBA1C in the last 72 hours.  Imaging: XR Chest 1 View Result Date: 06/06/2024 CLINICAL DATA:  Status post cardiac surgery. EXAM: CHEST  1 VIEW COMPARISON:  June 04, 2024. FINDINGS: Stable cardiomediastinal silhouette. Status post coronary artery bypass graft. Right lung is clear. Left-sided chest tube has been removed without pneumothorax. Mild left basilar atelectasis with small pleural effusion.   Left-sided chest tube has been removed without pneumothorax. Mild left basilar atelectasis with small pleural effusion. Electronically Signed   By: Lynwood Landy Raddle M.D.   On: 06/06/2024 09:46   XR Chest 1 View Result Date: 06/04/2024 EXAM: 1 VIEW XRAY OF THE CHEST 06/04/2024 04:48:00 AM COMPARISON: None available. CLINICAL HISTORY: POD 1 Open [facility]arts Surgery. FINDINGS: LUNGS AND PLEURA: No focal pulmonary opacity. No pulmonary edema. No pneumothorax. Left chest drain is partially retracted. HEART AND MEDIASTINUM: No acute abnormality of the cardiac and mediastinal silhouettes. Sternotomy wires and CABG marker. Swan-Ganz has been retracted, tip lower lobe branch right pulmonary artery. Mediastinal drain partially retracted. BONES AND SOFT TISSUES: No acute osseous abnormality. Left clavicle orthopedic fixation hardware.   1. No acute process. 2. Interval extubation and partial retraction of Swan-Ganz catheter, left chest drain, and mediastinal drain. Electronically signed by: Katheleen Faes MD 06/04/2024 09:02 AM EDT RP Workstation: [id number]   XR Chest 1 View Result Date: 06/03/2024 CLINICAL DATA:  Postoperative open heart surgery. EXAM: CHEST  1 VIEW COMPARISON:  05/30/2024 FINDINGS: Interval postoperative changes with sternotomy wires, surgical clips, and vascular markers in the mediastinum. An endotracheal tube has been placed with tip measuring 3.7 cm above the carina. A right Swan-Ganz catheter has been placed with tip projecting over the right lower lobe pulmonary artery. Mediastinal drain and left chest tube. Heart size is normal. Shallow inspiration. Lungs are clear. No pleural effusion or pneumothorax. Old plate and screw fixation of the left clavicle.   Interval  postoperative changes in the mediastinum. Appliances appear in satisfactory position. Lungs are clear. Electronically Signed   By: Elsie Gravely M.D.   On: 06/03/2024 19:28   XR Chest 2 Views Result Date: 05/30/2024 CLINICAL DATA:  Preoperative evaluation. EXAM: CHEST - 2 VIEW COMPARISON:  05/16/2024. FINDINGS: The heart size and mediastinal contours are within normal limits. No focal consolidation, pleural effusion, or pneumothorax. Partially visualized left clavicular fixation hardware. Mild degenerative changes of the thoracic spine. No acute osseous abnormality.   No active cardiopulmonary disease. Electronically Signed   By: Harrietta Sherry M.D.   On: 05/30/2024 16:05   XR Chest 2 Views Result Date: 05/16/2024 CLINICAL DATA:  Preop for upcoming open heart surgery. EXAM: CHEST - 2 VIEW COMPARISON:  02/11/2022 FINDINGS: The heart is normal in size. The cardiomediastinal contours are normal. The lungs are clear. Pulmonary vasculature is normal. No consolidation, pleural effusion, or pneumothorax. Surgical hardware in the left clavicle. Mild thoracic degenerative change. No acute osseous abnormalities are seen.   No active cardiopulmonary disease. Electronically Signed   By: Andrea Gasman M.D.   On: 05/16/2024 15:48   US  Duplex Ext Vein Map Lower Bilateral Result Date: 05/16/2024  [facility]                                                 [facility]                                                 [facility] and                                                  Vascular                                                  [address]                                                  [place]                                                  [place] [address]                                          Vein Mapping  Report Name  GELISA, TIEKEN MCDOWELL             Study Date  05-16-2024 01 19 PM MRN  [id number]                           Patient Location  [facility] DOB  1951-03-18                         Gender  Female Age  80 yrs                             Ethnicity  1 Ordering Physician  GRETTA CORDELLA HAMILTON Referring Physician  GRETTA CORDELLA SCOTT Performed By  ELIZABETHANN JANSKY Interpretation Summary Lower extremity vein mapping procedure as noted. The right gsv terminates in the mid thigh. Patient did state she has a history of vein stripping Procedure Vein mapping of both lower extremities was performed. Right Greater Saphenous Findings Distal thigh measurement is 3.51 mm. Mid thigh measurement is 2.24 mm. Below knee measurement is 3.06 mm. Mid calf measurement is 2.6 mm. Above ankle measurement is 2.08 mm. Left Greater Saphenous Findings Distal thigh measurement is 3.68 mm. Mid thigh measurement is 2.5 mm. High thigh measurement is 3.6 mm. Origin measurement is 4.5 mm. Left Lesser Saphenous Findings Upper calf diameter is 2.3 mm. Mid calf diameter is 2.3 mm. ______________________________________________________________________________ Electronically signed by MD Alverna JONELLE Sieving, MD, [id number]   on   05-16-2024 03 20 PM  Transthoracic echo (TTE) complete Result Date: 05/15/2024                                                                                                     Version  1                                                                                                     Study ID  [id number]                                                                  +--------------------------------------------------+                           +----------+  [facility]                                                                                                                           [facility]                                       +--------------------------------------------------+                                                                                                                                           +----------+ [facility] and Vascular [address] Transthoracic Echocardiogram Report Name  AMAIRA, SAFLEY MCDOWELL                        Study Date  05-15-2024, 2  37 PM                 Height  66 in MRN  [id number]                                      Patient Location  [facility]                     Weight  172.003 lb DOB  09-09-1951  [id number]                        Birth Gender  Female                             BSA  1.88 m Age  70 Years                                       Ethnicity  1                                    BP  130 - 57 mmHg  HR  51 bpm Reason For Study  coronary artery disease History  CAD Ordering Physician  DANIEL, KURT ROBERT Performed By  CORRINE RIGGS Referring Physician  DANIEL, KURT ROBERT PROCEDURE A two-dimensional transthoracic echocardiogram with color flow and Doppler was performed. Images from the parasternal window were difficult to obtain and are suboptimal in quality. The left ventricular size is normal. There is normal left ventricular wall thickness. LV ejection fraction = 55-60%. The right ventricle is normal size. The right ventricular systolic function is normal. The left atrium is mildly dilated. There is mild aortic regurgitation. There is mild mitral regurgitation. IVC size was normal. There is no pericardial effusion. LEFT VENTRICLE The left ventricular size is normal. There is normal left ventricular wall thickness. LV ejection fraction = 55-60%. RIGHT VENTRICLE The right  ventricle is normal size. The right ventricular systolic function is normal. LEFT ATRIUM The left atrium is mildly dilated. RIGHT ATRIUM Right atrial size is normal. AORTIC VALVE The aortic valve is normal in structure and function. There is no aortic stenosis. There is mild aortic regurgitation. MITRAL VALVE The mitral valve is normal in structure and function. There is mild mitral regurgitation. TRICUSPID VALVE The tricuspid valve is normal in structure and function. There is trace tricuspid regurgitation. PULMONIC VALVE The pulmonic valve is not well visualized. VENOUS IVC size was normal. EFFUSION There is no pericardial effusion. MMode-2D Measurements & Calculations EDV MOD-sp4   77.9 ml                 EF A4C  62.9 %                         ESV MOD-sp4   28.9 ml  IVSd  1.00 cm LA dim  4.3 cm                         LA ESV  BP   68.7 ml                  LA ESV Index  A2C  34.6 ml-m        LA ESV Index  A4C   34.6 ml-m LA ESV Index  BP   36.5 ml-m         LVIDd  4.5 cm                          LVIDs  3.0 cm          LVOT diam  2.11 cm LVPWd  1.07 cm                        SV A4C  49.0 ml Doppler Measurements & Calculations AI dec slope  205.6 cm-sec           AI max PG  67.0 mmHg                   AI max vel  408.7 cm-sec               Ao max PG  7.0 mmHg Ao V2 max  132.3 cm-sec                E-Lat E  8.0                         E-Med E  12.8  Lat Peak E  Vel  11.5 cm-sec LV V1 VTI  23.3 cm                    Med Peak E  Vel  7.2 cm-sec           MR max PG  98.2 mmHg    MV A max vel  88.6 cm-sec MV dec time  0.21 sec                  MV E max vel  92.2 cm-sec              PA max PG  3.3 mmHg   SV index LVOT   43.7 ml-m Other Measurements & Calculations AI P1-2t  582.2 msec                  BSA  1.88 m                          MV E-A  1.04            SI MOD-sp4   26.1 ml-m SV LVOT   81.9 ml                      SV MOD-sp4   49.0 ml  ______________________________________________________________________________                                         MD Gerhardt Mana, MD, [phone]       05-15-2024, 3  28 PM  Cardiac catheterization Result Date: 05/15/2024 PERFORMING CARDIOLOGIST: Alverna Sieving, DO FACC INDICATIONS:  Angina, Canadian Class 4 ACCESS SITE: Right ulnar artery (by handheld ultrasound, the radial artery appeared diminutive) PROCEDURE:  Coronary angiography, Left heart cath, intracoronary IVUS DIAGNOSTIC FINDINGS:   1.  Severe stenosis of the ostial LAD with some involvement of the short left main and the ostial circumflex 2.  Patent mid LAD stent. 3.  Otherwise nonobstructive coronary artery disease 4.  Normal LVEDP, 6 mmHg. COMPLICATIONS:  None EBL: <25 mL RECOMMENDATIONS: CTS consult to consider CABG. Continue medical continue medical therapy for CAD and CVD risk factor management. Note: After the procedure, I was called to the holding room because the patient was complaining of pain in her hand and arm.  This was associated with the radial band.  The arm appeared normal with no palpable hematoma.  The muscle compartments were not tight.  The hand was warm.  There was mild venous congestion, typical appearance for a hemostatic radial artery band.  There was a palpable ulnar pulse and a palpable radial pulse.  Hand-held ultrasound was used to verify pulsatile blood flow going antegrade both through the radial artery and ulnar artery.  We are continuing conservative management. Alverna Sieving, DO [facility] 05/15/2024  2:25 PM    Patient Instructions:  Ms. Newman was given explicit instructions on medications, diet, activity levels, and woundcare:   she Follow move in the tubeT instructions:  Follow instructions on the Open Heart Surgery Path to Recovery Sheetmove in the tubeT instructions Do not lift or push outside of the tube if it causes pain Take at least 4  short walks per day Shower daily and clean all wounds with antibacterial soap she is to notify his  provider if she feels her sternum is unstable at any time.  she is to check her temperature daily and notify his provider for a temperature greater than 101.5.  she isto monitor her pulse daily and notify his provider of a pulse rate greater than 110 bpm or less than 60 bpm.  she is to monitor her blood pressure daily and notify his provider of a systolic pressure greater than140 mmHg or less than 80 mmHg.  she is to weigh her self daily and notify his provider if she gains over 3 pounds in one day or 5 pounds in 1 week.  she is instructed to quit smoking; assistance in smokingcessation was made available to her.  she is instructed to use the incentive spirometer 4 times each day performing 10 inhalations each time. she is to start a daily walking regimen which willresult in at least one mile a day of walking within the next 3-4 weeks and resume all other activities as tolerated.  she is instructed not to use ointments, creams, or lotions on the incisions.  sheis instructed to call if there is redness, drainage, foul-smelling, cloudy, brown or green drainage or opening of any of her incisions  she is instructed to continue her prescribed diet, a no concentrated sweets,and add additional protein and caloric supplements to promote would healing.  she is instructed to take all her medications as prescribed below. Pt was also instructed to bring all hermedications upon return to the clinic.  Ms. Papin demonstrated understanding of these instructions and agreed to follow them.  Medication Instructions:         Current Discharge Medication List      Current Discharge Medication List     CONTINUE these medications which have NOT CHANGED   Details  aspirin  81 mg EC tablet Take 1 tablet (81 mg total) by mouth daily. Qty: 90 tablet, Refills: 0    atorvastatin  (LIPITOR) 80 mg tablet Take 1 tablet  (80 mg total) by mouth at bedtime. Qty: 90 tablet, Refills: 3    carvedilol (COREG CR) 40 mg 24 hr capsule Take 40 mg by mouth daily.    clopidogreL (PLAVIX) 75 mg tablet Take 1 tablet (75 mg total) by mouth daily. Qty: 90 tablet, Refills: 0    Corlanor 7.5 mg tablet Take 7.5 mg by mouth in the morning and 7.5 mg in the evening. Take with meals.    DULoxetine  (CYMBALTA ) 30 mg capsule Take 30 mg by mouth daily.    KlonoPIN  1 mg tablet Take 1 mg by mouth 2 (two) times a day.    methocarbamoL  (ROBAXIN ) 750 mg tablet Take 750 mg by mouth 4 (four) times a day.    olmesartan (BENICAR) 40 mg tablet Take 40 mg by mouth daily.    zolpidem  (AMBIEN ) 10 mg tablet Take 10 mg by mouth nightly as needed.    gabapentin  (NEURONTIN ) 300 mg capsule Take 600 mg by mouth 3 (three) times a day.        Current Discharge Medication List     START taking these medications   Details  ferrous sulfate 325 mg (65 mg iron) tablet Take 1 tablet (325 mg total) by mouth daily before breakfast. For 6 months    oxyCODONE  (ROXICODONE ) 5 mg immediate release tablet Take 1 tablet (5 mg total) by mouth every 4 (four) hours as needed for severe pain (7-10). Qty: 20 tablet, Refills: 0    polyethylene glycol (GLYCOLAX ) 17 gram packet Take 17 g by  mouth daily as needed for constipation.        Current Discharge Medication List     STOP taking these medications     mupirocin (BACTROBAN) 2 % ointment Comments:  Reason for Stopping:       ranolazine (RANEXA) 1,000 mg 12 hr tablet Comments:  Reason for Stopping:       nitroglycerin (NITROSTAT) 0.3 mg SL tablet Comments:  Reason for Stopping:          Scheduled Future Appointments       Provider Department Dept Phone Center   07/10/2024 11:45 AM [facility]  - Cardiothoracic Surgery [phone]        After your follow up appointment all medication refills will need to go to your PCP or cardiologist   25  minutes were spent providing discharge services.  Cordella Glendia Gaskins, PA-C, Cardiothoracic Surgery  Cordella Glendia Gaskins, PA-C 06/08/2024 10:10 AM

## 2024-06-11 ENCOUNTER — Emergency Department (HOSPITAL_COMMUNITY)

## 2024-06-11 ENCOUNTER — Encounter (HOSPITAL_COMMUNITY): Payer: Self-pay | Admitting: Radiology

## 2024-06-11 ENCOUNTER — Observation Stay (HOSPITAL_COMMUNITY)
Admission: EM | Admit: 2024-06-11 | Discharge: 2024-06-12 | Disposition: A | Discharge status: Home / Self Care | Attending: Student | Admitting: Student

## 2024-06-11 DIAGNOSIS — J90 Pleural effusion, not elsewhere classified: Secondary | ICD-10-CM | POA: Diagnosis not present

## 2024-06-11 DIAGNOSIS — K219 Gastro-esophageal reflux disease without esophagitis: Secondary | ICD-10-CM | POA: Insufficient documentation

## 2024-06-11 DIAGNOSIS — T424X5A Adverse effect of benzodiazepines, initial encounter: Secondary | ICD-10-CM | POA: Diagnosis not present

## 2024-06-11 DIAGNOSIS — N390 Urinary tract infection, site not specified: Secondary | ICD-10-CM

## 2024-06-11 DIAGNOSIS — Z951 Presence of aortocoronary bypass graft: Secondary | ICD-10-CM | POA: Diagnosis not present

## 2024-06-11 DIAGNOSIS — M6281 Muscle weakness (generalized): Secondary | ICD-10-CM | POA: Diagnosis not present

## 2024-06-11 DIAGNOSIS — Z959 Presence of cardiac and vascular implant and graft, unspecified: Secondary | ICD-10-CM | POA: Insufficient documentation

## 2024-06-11 DIAGNOSIS — Z79899 Other long term (current) drug therapy: Secondary | ICD-10-CM | POA: Insufficient documentation

## 2024-06-11 DIAGNOSIS — J189 Pneumonia, unspecified organism: Secondary | ICD-10-CM | POA: Insufficient documentation

## 2024-06-11 DIAGNOSIS — G8929 Other chronic pain: Secondary | ICD-10-CM | POA: Diagnosis not present

## 2024-06-11 DIAGNOSIS — I2510 Atherosclerotic heart disease of native coronary artery without angina pectoris: Secondary | ICD-10-CM | POA: Diagnosis not present

## 2024-06-11 DIAGNOSIS — G928 Other toxic encephalopathy: Secondary | ICD-10-CM | POA: Diagnosis not present

## 2024-06-11 DIAGNOSIS — T402X5A Adverse effect of other opioids, initial encounter: Secondary | ICD-10-CM

## 2024-06-11 DIAGNOSIS — F39 Unspecified mood [affective] disorder: Secondary | ICD-10-CM | POA: Diagnosis not present

## 2024-06-11 DIAGNOSIS — Z8679 Personal history of other diseases of the circulatory system: Secondary | ICD-10-CM | POA: Insufficient documentation

## 2024-06-11 DIAGNOSIS — G9340 Encephalopathy, unspecified: Principal | ICD-10-CM | POA: Insufficient documentation

## 2024-06-11 DIAGNOSIS — T43205A Adverse effect of unspecified antidepressants, initial encounter: Secondary | ICD-10-CM

## 2024-06-11 DIAGNOSIS — T40425A Adverse effect of tramadol, initial encounter: Secondary | ICD-10-CM | POA: Diagnosis not present

## 2024-06-11 DIAGNOSIS — T426X5A Adverse effect of other antiepileptic and sedative-hypnotic drugs, initial encounter: Secondary | ICD-10-CM | POA: Diagnosis not present

## 2024-06-11 DIAGNOSIS — G4700 Insomnia, unspecified: Secondary | ICD-10-CM | POA: Insufficient documentation

## 2024-06-11 DIAGNOSIS — D320 Benign neoplasm of cerebral meninges: Principal | ICD-10-CM | POA: Insufficient documentation

## 2024-06-11 DIAGNOSIS — I10 Essential (primary) hypertension: Secondary | ICD-10-CM | POA: Insufficient documentation

## 2024-06-11 DIAGNOSIS — T428X5A Adverse effect of antiparkinsonism drugs and other central muscle-tone depressants, initial encounter: Secondary | ICD-10-CM | POA: Insufficient documentation

## 2024-06-11 DIAGNOSIS — R69 Illness, unspecified: Secondary | ICD-10-CM

## 2024-06-11 DIAGNOSIS — R4701 Aphasia: Secondary | ICD-10-CM | POA: Diagnosis present

## 2024-06-11 LAB — DIFFERENTIAL
Abs Immature Granulocytes: 0.11 K/uL — ABNORMAL HIGH (ref 0.00–0.07)
Basophils Absolute: 0 K/uL (ref 0.0–0.1)
Basophils Relative: 0 %
Eosinophils Absolute: 0.5 K/uL (ref 0.0–0.5)
Eosinophils Relative: 6 %
Immature Granulocytes: 1 %
Lymphocytes Relative: 21 %
Lymphs Abs: 1.8 K/uL (ref 0.7–4.0)
Monocytes Absolute: 0.9 K/uL (ref 0.1–1.0)
Monocytes Relative: 10 %
Neutro Abs: 5.5 K/uL (ref 1.7–7.7)
Neutrophils Relative %: 62 %

## 2024-06-11 LAB — COMPREHENSIVE METABOLIC PANEL WITH GFR
ALT: 11 U/L (ref 0–44)
AST: 17 U/L (ref 15–41)
Albumin: 3.1 g/dL — ABNORMAL LOW (ref 3.5–5.0)
Alkaline Phosphatase: 69 U/L (ref 38–126)
Anion gap: 11 (ref 5–15)
BUN: 10 mg/dL (ref 8–23)
CO2: 19 mmol/L — ABNORMAL LOW (ref 22–32)
Calcium: 9.1 mg/dL (ref 8.9–10.3)
Chloride: 105 mmol/L (ref 98–111)
Creatinine, Ser: 0.99 mg/dL (ref 0.44–1.00)
GFR, Estimated: >60 mL/min (ref >60)
Glucose, Bld: 168 mg/dL — ABNORMAL HIGH (ref 70–99)
Potassium: 4 mmol/L (ref 3.5–5.1)
Sodium: 135 mmol/L (ref 135–145)
Total Bilirubin: 0.7 mg/dL (ref 0.0–1.2)
Total Protein: 6.7 g/dL (ref 6.5–8.1)

## 2024-06-11 LAB — I-STAT VENOUS BLOOD GAS, ED
Acid-base deficit: 2 mmol/L (ref 0.0–2.0)
Bicarbonate: 21.4 mmol/L (ref 20.0–28.0)
Calcium, Ion: 1.17 mmol/L (ref 1.15–1.40)
HCT: 26 % — ABNORMAL LOW (ref 36.0–46.0)
Hemoglobin: 8.8 g/dL — ABNORMAL LOW (ref 12.0–15.0)
O2 Saturation: 70 %
Potassium: 4.1 mmol/L (ref 3.5–5.1)
Sodium: 139 mmol/L (ref 135–145)
TCO2: 22 mmol/L (ref 22–32)
pCO2, Ven: 31.3 mmHg — ABNORMAL LOW (ref 44–60)
pH, Ven: 7.442 — ABNORMAL HIGH (ref 7.25–7.43)
pO2, Ven: 35 mmHg (ref 32–45)

## 2024-06-11 LAB — I-STAT CHEM 8, ED
BUN: 10 mg/dL (ref 8–23)
Calcium, Ion: 1.18 mmol/L (ref 1.15–1.40)
Chloride: 106 mmol/L (ref 98–111)
Creatinine, Ser: 0.9 mg/dL (ref 0.44–1.00)
Glucose, Bld: 168 mg/dL — ABNORMAL HIGH (ref 70–99)
HCT: 28 % — ABNORMAL LOW (ref 36.0–46.0)
Hemoglobin: 9.5 g/dL — ABNORMAL LOW (ref 12.0–15.0)
Potassium: 4.1 mmol/L (ref 3.5–5.1)
Sodium: 139 mmol/L (ref 135–145)
TCO2: 22 mmol/L (ref 22–32)

## 2024-06-11 LAB — URINALYSIS, ROUTINE W REFLEX MICROSCOPIC
Bilirubin Urine: NEGATIVE
Glucose, UA: NEGATIVE mg/dL
Hgb urine dipstick: NEGATIVE
Ketones, ur: NEGATIVE mg/dL
Leukocytes,Ua: NEGATIVE
Nitrite: NEGATIVE
Protein, ur: NEGATIVE mg/dL
Specific Gravity, Urine: 1.013 (ref 1.005–1.030)
pH: 7 (ref 5.0–8.0)

## 2024-06-11 LAB — AMMONIA: Ammonia: 14 umol/L (ref 9–35)

## 2024-06-11 LAB — CBC
HCT: 30.3 % — ABNORMAL LOW (ref 36.0–46.0)
Hemoglobin: 9.3 g/dL — ABNORMAL LOW (ref 12.0–15.0)
MCH: 29.4 pg (ref 26.0–34.0)
MCHC: 30.7 g/dL (ref 30.0–36.0)
MCV: 95.9 fL (ref 80.0–100.0)
Platelets: 459 K/uL — ABNORMAL HIGH (ref 150–400)
RBC: 3.16 MIL/uL — ABNORMAL LOW (ref 3.87–5.11)
RDW: 13.9 % (ref 11.5–15.5)
WBC: 8.9 K/uL (ref 4.0–10.5)
nRBC: 0 % (ref 0.0–0.2)

## 2024-06-11 LAB — PROTIME-INR
INR: 1.1 (ref 0.8–1.2)
Prothrombin Time: 14.8 s (ref 11.4–15.2)

## 2024-06-11 LAB — TROPONIN I (HIGH SENSITIVITY): Troponin I (High Sensitivity): 18 ng/L — ABNORMAL HIGH (ref <18)

## 2024-06-11 LAB — ETHANOL: Alcohol, Ethyl (B): <15 mg/dL (ref <15)

## 2024-06-11 LAB — CBG MONITORING, ED: Glucose-Capillary: 160 mg/dL — ABNORMAL HIGH (ref 70–99)

## 2024-06-11 LAB — APTT: aPTT: 32 s (ref 24–36)

## 2024-06-11 MED ORDER — DIPHENHYDRAMINE HCL 50 MG/ML IJ SOLN
INTRAMUSCULAR | Status: AC
  Filled 2024-06-11: qty 1

## 2024-06-11 MED ORDER — METHYLPREDNISOLONE SODIUM SUCC 125 MG IJ SOLR
INTRAMUSCULAR | Status: AC
Start: 2024-06-11 — End: 2024-06-11
  Filled 2024-06-11: qty 2

## 2024-06-11 MED ORDER — METHYLPREDNISOLONE SODIUM SUCC 125 MG IJ SOLR
125.0000 mg | Freq: Once | INTRAMUSCULAR | Status: AC
  Administered 2024-06-11: 125 mg via INTRAVENOUS

## 2024-06-11 MED ORDER — SODIUM CHLORIDE 0.9% FLUSH
3.0000 mL | Freq: Once | INTRAVENOUS | Status: AC
  Administered 2024-06-11: 3 mL via INTRAVENOUS

## 2024-06-11 MED ORDER — DIPHENHYDRAMINE HCL 50 MG/ML IJ SOLN
25.0000 mg | Freq: Once | INTRAMUSCULAR | Status: AC
  Administered 2024-06-11: 25 mg via INTRAVENOUS

## 2024-06-11 MED ORDER — IOHEXOL 350 MG/ML SOLN
75.0000 mL | Freq: Once | INTRAVENOUS | Status: AC | PRN
  Administered 2024-06-11: 75 mL via INTRAVENOUS

## 2024-06-11 NOTE — Progress Notes (Signed)
 cbc

## 2024-06-11 NOTE — Telephone Encounter (Addendum)
 Portions of this note were dictated using Animal nutritionist.  It has been reviewed for accuracy, but may contain grammatical and clerical errors.   Had a long talk with patient's daughter regarding her status right now.  States that her mother did not walk or get out of bed the last 2 days of her hospital stay and right before she was discharged.  On the 28th 2 days before discharge there is a PT note that was read to the patient's daughter that stated she was ambulating without assistant device for 500 feet and also doing chairs getting up to standing position by self was doing well.  No recommendation for skilled nursing or home health.  Patient stated that since mother got home she has not gotten out of bed and has not been walking.  She also stated some issues with blood pressure and weight.  I talked to patient this morning and she said her weight was 155 and at discharge from the hospital is 163 so there is no great weight gain but a 8 pound weight loss since discharge so there seems to be some communication issues also noted that by daughter that patient had hypertension however recent systolics were in the 115 range.  Since there is seemingly a status change we are going to see patient in clinic tomorrow and Cathlyn Gaskins is ordered CBC.  Let the patient know that home health physical therapy would only be out 1 or 2 times a week at an hour each time to help however with physical therapy home health or skilled nursing that she would have to go to the ED to speak to case management and insurance for her to get in so as of now she will be assessed in our clinic tomorrow and if needed we will make that recommendation.

## 2024-06-11 NOTE — ED Triage Notes (Signed)
 BIB [facility] from home. At 2000 family reports pt became weak and unable to talk. EMS reports aphasia and L sided arm weakness on scene. Hx CABG, on plavix. No interventions en route.

## 2024-06-11 NOTE — ED Provider Notes (Addendum)
 [place] EMERGENCY DEPARTMENT AT [facility] Provider Note   CSN: [id number] Arrival date & time: 06/11/24  2101  An emergency department physician performed an initial assessment on this suspected stroke patient at 2101.  Patient presents with: Aphasia   Rachel Mitchell is a 73 y.o. female.   HPI On 06/03/2024, Ms. Ziesmer was taken to the OR for the following procedure: CABG x 2. LIMA to the mid LAD, saphenous vein graft to the first obtuse marginal via sternotomy.  Patient has been at home recovering with family members.  They report she has remained very generally weak but has been eating.  She has required assistance for getting up to go to the bathroom or transitioning to the table.  They report today that the patient became even more lethargic.  She seemed to be resting so her daughter went and talked to her father about medications, when she came back to check on the patient, the patient was standing and had walked up to her which was very unusual, she has not been walking independently then she went back and sat in the middle of the couch and looked at her daughter without really responding and then listed over onto her side on the couch.  Patient's daughter had noted that she seemed to be breathing very deeply.  They called EMS.  The patient did become responsive again but was extremely lethargic.  On EMS arrival the identified a suspected left-sided weakness and initiated code stroke.  Patient was initially evaluated under code stroke.  Patient has 4 many years been taking gabapentin  and methocarbamol .  Family thinks there may be some dose increases around her surgical time.  Patient also is taking some additional pain medications.  Unclear how much she had taken today.  They have not noted any fevers.  Patient has not had any vomiting or diarrhea.  She has not appeared significantly dyspneic.    Prior to Admission medications   Medication Sig Start Date End Date Taking?  Authorizing Provider  amoxicillin -clavulanate (AUGMENTIN ) 875-125 MG tablet Take 1 tablet by mouth every 12 (twelve) hours. 10/02/20   Patel, Shalyn, PA-C  clonazePAM  (KLONOPIN ) 1 MG tablet Take 1 mg by mouth 2 (two) times daily as needed. 09/29/20   [provider]  DULoxetine  (CYMBALTA ) 30 MG capsule Take 30 mg by mouth daily. 09/05/20   [provider]  estrogens, conjugated, (PREMARIN) 0.3 MG tablet Take by mouth. 03/06/18   [provider]  gabapentin  (NEURONTIN ) 300 MG capsule Take by mouth.    [provider]  loratadine (CLARITIN) 10 MG tablet Take by mouth.    [provider]  metoprolol  succinate (TOPROL -XL) 25 MG 24 hr tablet Take 25 mg by mouth daily. 08/17/20   [provider]  Multiple Vitamin (MULTI-VITAMIN) tablet Take 1 tablet by mouth daily.    [provider]  olmesartan (BENICAR) 40 MG tablet Take by mouth.    [provider]  omeprazole (PRILOSEC) 40 MG capsule Take by mouth.    [provider]  ondansetron  (ZOFRAN  ODT) 4 MG disintegrating tablet Take 1 tablet (4 mg total) by mouth every 8 (eight) hours as needed for nausea or vomiting. 10/02/20   Patel, Shalyn, PA-C  pravastatin  (PRAVACHOL ) 40 MG tablet Take 40 mg by mouth daily. 05/24/20   [provider]  traMADol (ULTRAM) 50 MG tablet tramadol 50 mg tablet  Take 1 tablet every 4-6 hours by oral route as needed.    [provider]  verapamil  (CALAN -SR) 180 MG CR tablet Take by mouth. 09/16/20   [provider]  VERAPAMIL  HCL PO Take 180 mg by mouth in the morning and at bedtime.    [provider]  zolpidem  (AMBIEN ) 10 MG tablet Take by mouth. 09/30/14   [provider]    Allergies: Contrast media [iodinated contrast media], Tramadol, Hydrocodone-acetaminophen , Niacin, and Shellfish allergy    Review of Systems  Updated Vital Signs BP (!) 178/51   Pulse 62   Temp (!) 97 F (36.1 C) (Axillary)    Resp (!) 21   Wt 74.4 kg   SpO2 97%   BMI 26.47 kg/m   Physical Exam Constitutional:      Comments: Patient is somnolent.  She does arouse to vocal stimulus and light touch.  She is not answering questions.  No respiratory distress.  HENT:     Head: Normocephalic and atraumatic.     Mouth/Throat:     Pharynx: Oropharynx is clear.  Eyes:     Extraocular Movements: Extraocular movements intact.     Pupils: Pupils are equal, round, and reactive to light.  Cardiovascular:     Rate and Rhythm: Normal rate and regular rhythm.  Pulmonary:     Effort: Pulmonary effort is normal.     Breath sounds: Normal breath sounds.     Comments: Chest incision is healing well.  No erythema.  No drainage. Abdominal:     General: There is no distension.     Palpations: Abdomen is soft.     Tenderness: There is no abdominal tenderness. There is no guarding.  Musculoskeletal:        General: No swelling or tenderness. Normal range of motion.     Right lower leg: No edema.     Left lower leg: No edema.     Comments: No peripheral edema.  Calves are symmetric and soft.  Skin:    General: Skin is warm and dry.  Neurological:     Comments: Patient is very lethargic.  She does rouse to the stimulus but goes immediately back to sleep.  I can get her to make a little bit of effort towards closing her hand when I put my fingers in her hand for grip strength.  She will not spontaneously move her legs.     (all labs ordered are listed, but only abnormal results are displayed) Labs Reviewed  CBC - Abnormal; Notable for the following components:      Result Value   RBC 3.16 (*)    Hemoglobin 9.3 (*)    HCT 30.3 (*)    Platelets 459 (*)    All other components within normal limits  DIFFERENTIAL - Abnormal; Notable for the following components:   Abs Immature Granulocytes 0.11 (*)    All other components within normal limits  COMPREHENSIVE METABOLIC PANEL WITH GFR - Abnormal; Notable for the following  components:   CO2 19 (*)    Glucose, Bld 168 (*)    Albumin 3.1 (*)    All other components within normal limits  I-STAT CHEM 8, ED - Abnormal; Notable for the following components:   Glucose, Bld 168 (*)    Hemoglobin 9.5 (*)    HCT 28.0 (*)    All other components within normal limits  CBG MONITORING, ED - Abnormal; Notable for the following components:   Glucose-Capillary 160 (*)    All other components within normal limits  I-STAT VENOUS BLOOD GAS, ED - Abnormal; Notable for  the following components:   pH, Ven 7.442 (*)    pCO2, Ven 31.3 (*)    HCT 26.0 (*)    Hemoglobin 8.8 (*)    All other components within normal limits  PROTIME-INR  APTT  ETHANOL  AMMONIA  URINALYSIS, ROUTINE W REFLEX MICROSCOPIC  TROPONIN I (HIGH SENSITIVITY)    EKG: EKG Interpretation Date/Time:  Tuesday June 11 2024 21:36:54 EDT Ventricular Rate:  61 PR Interval:  151 QRS Duration:  98 QT Interval:  399 QTC Calculation: 402 R Axis:   15  Text Interpretation: Sinus rhythm wandering baseline, nonspecific t wave flattening diffusely compared to previoius Confirmed by Armenta Canning [phone]) on 06/11/2024 9:55:20 PM  Radiology: [facility] Chest Port 1 View Result Date: 06/11/2024 CLINICAL DATA:  Altered mental status. EXAM: PORTABLE CHEST 1 VIEW COMPARISON:  Chest radiograph dated 06/06/2024 FINDINGS: A small left pleural effusion and left lung base atelectasis or infiltrate. The right lung is clear. No pneumothorax. Stable cardiac silhouette. Median sternotomy wires. No acute osseous pathology. Left clavicular fixation hardware. IMPRESSION: Small left pleural effusion and left lung base atelectasis or infiltrate. Electronically Signed   By: Vanetta Chou M.D.   On: 06/11/2024 22:29   CT ANGIO HEAD NECK W WO CM (CODE STROKE) Result Date: 06/11/2024 EXAM: CT HEAD WITHOUT CTA HEAD AND NECK WITH AND WITHOUT 06/11/2024 09:22:31 PM TECHNIQUE: CTA of the head and neck was performed with and without the  administration of intravenous contrast. Noncontrast CT of the head with reconstructed 2-D images are also provided for review. Multiplanar 2D and/or 3D reformatted images are provided for review. Automated exposure control, iterative reconstruction, and/or weight based adjustment of the mA/kV was utilized to reduce the radiation dose to as low as reasonably achievable. COMPARISON: 10/11/2020 brain MRI CLINICAL HISTORY: Neuro deficit, acute, stroke suspected. Slight delay d/t contrast allergy, solumedrol ad benadryl  were given on the table per dr voncile. FINDINGS: CT HEAD: BRAIN AND VENTRICLES: No acute intracranial hemorrhage. No mass effect. No midline shift. No extra-axial fluid collection. No evidence of acute infarct. No hydrocephalus. Small meningioma anterior to the left temporal lobe, unchanged. ORBITS: No acute abnormality. SINUSES AND MASTOIDS: No acute abnormality. CTA NECK: AORTIC ARCH AND ARCH VESSELS: No dissection or arterial injury. No significant stenosis of the brachiocephalic or subclavian arteries. 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 CERVICAL VERTEBRAL ARTERIES: No dissection, arterial injury, or significant stenosis. LUNGS AND MEDIASTINUM: Unremarkable. SOFT TISSUES: No acute abnormality. BONES: No acute abnormality. CTA HEAD: ANTERIOR CIRCULATION: No significant stenosis of the internal carotid arteries. No significant stenosis of the anterior cerebral arteries. No significant stenosis of the middle cerebral arteries. No aneurysm. POSTERIOR CIRCULATION: No significant stenosis of the posterior cerebral arteries. No significant stenosis of the basilar artery. No significant stenosis of the vertebral arteries. No aneurysm. OTHER: No dural venous sinus thrombosis on this non-dedicated study. IMPRESSION: 1. No acute findings. 2.  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 3. Unchanged small meningioma anterior to the left temporal lobe. Electronically signed by: Franky Stanford MD 06/11/2024 09:38 PM EDT RP Workstation: [id number]   CT HEAD CODE STROKE WO CONTRAST Result Date: 06/11/2024 EXAM: CT HEAD WITHOUT 06/11/2024 09:08:40 PM TECHNIQUE: CT of the head was performed without the administration of intravenous contrast. Automated exposure control, iterative reconstruction, and/or weight based adjustment of the  mA/kV was utilized to reduce the radiation dose to as low as reasonably achievable. COMPARISON: 10/10/2020 CLINICAL HISTORY: Neuro deficit, acute, stroke suspected. FINDINGS: BRAIN AND VENTRICLES: No acute intracranial hemorrhage. No mass effect or midline shift. No extra-axial fluid collection. No evidence of acute infarct. No hydrocephalus. ASPECTS is 10. ORBITS: No acute abnormality. SINUSES AND MASTOIDS: No acute abnormality. SOFT TISSUES AND SKULL: No acute skull fracture. No acute soft tissue abnormality. IMPRESSION: 1. No acute intracranial abnormality. ASPECTS is 10. 2. Findings communicated to Dr. Ashish Arora at 9:19 pm on 06/11/24. Electronically signed by: Franky Stanford MD 06/11/2024 09:22 PM EDT RP Workstation: [id number]     Procedures  CRITICAL CARE Performed by: Ludivina Shines   Total critical care time: 30 minutes  Critical care time was exclusive of separately billable procedures and treating other patients.  Critical care was necessary to treat or prevent imminent or life-threatening deterioration.  Critical care was time spent personally by me on the following activities: development of treatment plan with patient and/or surrogate as well as nursing, discussions with consultants, evaluation of patient's response to treatment, examination of patient, obtaining history from patient  or surrogate, ordering and performing treatments and interventions, ordering and review of laboratory studies, ordering and review of radiographic studies, pulse oximetry and re-evaluation of patient's condition.  Medications Ordered in the ED  sodium chloride  flush (NS) 0.9 % injection 3 mL (3 mLs Intravenous Given 06/11/24 2124)  methylPREDNISolone  sodium succinate (SOLU-MEDROL ) 125 mg/2 mL injection 125 mg (125 mg Intravenous Given 06/11/24 2124)  diphenhydrAMINE  (BENADRYL ) injection 25 mg (25 mg Intravenous Given 06/11/24 2124)  iohexol  (OMNIPAQUE ) 350 MG/ML injection 75 mL (75 mLs Intravenous Contrast Given 06/11/24 2128)    Clinical Course as of 06/11/24 2249  Tue Jun 11, 2024  2155 DG Chest [place] 1 View [MP]    Clinical Course User Index [MP] Shines Ludivina, MD                                 Medical Decision Making Amount and/or Complexity of Data Reviewed Labs: ordered. Radiology: ordered. Decision-making details documented in ED Course.  Risk Decision regarding hospitalization.   Patient presents as outlined.  She is postop from two-vessel bypass surgery 8 days ago.  The patient became more lethargic today.  She has been fairly generally weak but eating and no fever or other focal problems identified by family.  Differential diagnosis includes metabolic derangement\infectious complication\CVA\medication reaction.  CT head and angio reviewed by radiology and Dr. Deedra did not show any occlusive vessels and patient was ruled out for probable stroke by Dr. Deedra.  GFR greater than 60 sodium 135 potassium 4.0 LFTs normal white count 8.9 H&H 9 and 30 CBG 160  EKG reviewed by myself no acute ischemic appearance.  Normal sinus rhythm.  Chest x-ray reviewed by myself, no pneumothorax, no focal consolidations.  X-ray is rotated.  Awaiting radiology review.  At this time I have highest suspicion for possible over sedation with medication.  Patient had been taking gabapentin  and  methocarbamol  historically with probably some dose increase per family members.  Also prescribed oxycodone  for pain control.  The patient will have to be admitted for encephalopathy unclear etiology at this time.  Consult: I did review this case with Dr. Erik of cardiothoracic surgery.  At this time they recommend observation on medical service but do not suspect immediate complications surgically.  They can consult to examine  the patient for her postoperative care.  Dr. Ruthine recommendations as follows: Impression: Evaluate for toxic metabolic encephalopathy related to medications versus underlying UTI, evaluate for stroke  RECOMMENDATIONS  I would attempt to get an MRI of the brain without contrast. Perform full stroke workup only if there is a clinical concern of stroke as her mentation improves. I was told by the RN that her mentation is improving and she is wiggling toes and fingers now which she was not doing before.  I will check a urinalysis, and urinary toxicology screen  I think she may need observation and therapy assessments prior to discharge.  If MRI is not possible, repeat head CT in about 24 hours to look for any evidence of acute infarction  Neurology will follow   Consult: Dr. Segars [facility] hospitalist for admission.  22: 48 patient is more awake.  She is now opening her eyes spontaneously and commenting on being able to move her toes.     Final diagnoses:  Encephalopathy, unspecified type  Severe comorbid illness    ED Discharge Orders     None          Armenta Canning, MD 06/11/24 2249    Armenta Canning, MD 06/11/24 2251

## 2024-06-11 NOTE — ED Notes (Signed)
 Notified neurologist that pt is now talking. Family reports speech is more mumbled than baseline. Pt able to wiggle fingers/toes but unable to lift extremities off bed.

## 2024-06-11 NOTE — ED Notes (Signed)
 CCMD called.

## 2024-06-11 NOTE — Code Documentation (Signed)
 Stroke Response Nurse Documentation Code Documentation  Rachel Mitchell is a 73 y.o. female arriving to [facility]  via [facility] on 9/2 with past medical hx of  hypertension, coronary artery disease, status post recent CABG on 06/03/2024. On No antithrombotic. Code stroke was activated by EMS.   Patient from home where she was LKW at 2000 and now complaining of AMS and speech difficulty.  Stroke team at the bedside on patient arrival. Labs drawn and patient cleared for CT by Dr. Laurice. Patient to CT with team. NIHSS 18, see documentation for details and code stroke times. Patient with decreased LOC, disoriented, bilateral arm weakness, bilateral leg weakness, and Expressive aphasia  on exam. The following imaging was completed:  CT Head and CTA. Patient is not a candidate for IV Thrombolytic due to recent CABG and acute blood loss anemia. Patient is not a candidate for IR due to No LVO.   Care Plan: Neuro checks q2 hrs   Bedside handoff with ED RN Annabella.    Griselda Alm ORN  Rapid Response RN

## 2024-06-11 NOTE — Consult Note (Signed)
 NEUROLOGY CONSULT NOTE   Date of service: June 11, 2024 Patient Name: Rachel Mitchell MRN:  [id number] DOB:  03-Apr-1951 Chief Complaint: Speech difficulty, altered mental status Requesting Provider: Armenta Canning, MD  History of Present Illness  Placida Cambre is a 73 y.o. female with hx of hypertension, coronary artery disease, status post recent CABG on 06/03/2024 done at [facility] facility, presents for evaluation of altered mental status and speech difficulty.  EMS was called for the altered mental status and speech difficulty which the family noticed right after 8 PM when she was seen normal at 8 PM.  EMS found her to be aphasic and had left arm weakness on scene for which a code stroke was activated She was evaluated at the [facility] emergently-see detailed exam below-she was drowsy and weak all over symmetrically and extremely hypophonic.  LKW: 8 PM Modified rankin score: 2-Slight disability-UNABLE to perform all activities but does not need assistance IV Thrombolysis: Nonfocal exam, recent CABG with also reports of blood loss anemia EVT: No ELVO  NIHSS components Score: Comment  1a Level of Conscious 0[]  1[x]  2[]  3[]      1b LOC Questions 0[]  1[]  2[x]       1c LOC Commands 0[x]  1[]  2[]       2 Best Gaze 0[x]  1[]  2[]       3 Visual 0[x]  1[]  2[]  3[]      4 Facial Palsy 0[x]  1[]  2[]  3[]      5a Motor Arm - left 0[]  1[]  2[]  3[x]  4[]  UN[]    5b Motor Arm - Right 0[]  1[]  2[]  3[x]  4[]  UN[]    6a Motor Leg - Left 0[]  1[]  2[]  3[]  4[x]  UN[]    6b Motor Leg - Right 0[]  1[]  2[]  3[]  4[x]  UN[]    7 Limb Ataxia 0[x]  1[]  2[]  UN[]      8 Sensory 0[x]  1[]  2[]  UN[]      9 Best Language 0[]  1[x]  2[]  3[]      10 Dysarthria 0[x]  1[]  2[]  UN[]      11 Extinct. and Inattention 0[x]  1[]  2[]       TOTAL: 18      ROS  Comprehensive ROS performed and pertinent positives documented in HPI   Past History   Past Medical History:  Diagnosis Date   Hypertension     Past Surgical History:   Procedure Laterality Date   ABDOMINAL HYSTERECTOMY      Family History: No family history on file.  Social History  reports that she has never smoked. She has never used smokeless tobacco. She reports that she does not drink alcohol and does not use drugs.  Allergies  Allergen Reactions   Contrast Media [Iodinated Contrast Media] Rash and Swelling   Tramadol Rash   Hydrocodone-Acetaminophen  Other (See Comments)   Niacin Itching   Shellfish Allergy Rash    Medications  No current facility-administered medications for this encounter.  Current Outpatient Medications:    amoxicillin -clavulanate (AUGMENTIN ) 875-125 MG tablet, Take 1 tablet by mouth every 12 (twelve) hours., Disp: 14 tablet, Rfl: 0   clonazePAM  (KLONOPIN ) 1 MG tablet, Take 1 mg by mouth 2 (two) times daily as needed., Disp: , Rfl:    DULoxetine  (CYMBALTA ) 30 MG capsule, Take 30 mg by mouth daily., Disp: , Rfl:    estrogens, conjugated, (PREMARIN) 0.3 MG tablet, Take by mouth., Disp: , Rfl:    gabapentin  (NEURONTIN ) 300 MG capsule, Take by mouth., Disp: , Rfl:    loratadine (CLARITIN) 10 MG tablet, Take by mouth., Disp: , Rfl:  metoprolol  succinate (TOPROL -XL) 25 MG 24 hr tablet, Take 25 mg by mouth daily., Disp: , Rfl:    Multiple Vitamin (MULTI-VITAMIN) tablet, Take 1 tablet by mouth daily., Disp: , Rfl:    olmesartan (BENICAR) 40 MG tablet, Take by mouth., Disp: , Rfl:    omeprazole (PRILOSEC) 40 MG capsule, Take by mouth., Disp: , Rfl:    ondansetron  (ZOFRAN  ODT) 4 MG disintegrating tablet, Take 1 tablet (4 mg total) by mouth every 8 (eight) hours as needed for nausea or vomiting., Disp: 20 tablet, Rfl: 0   pravastatin  (PRAVACHOL ) 40 MG tablet, Take 40 mg by mouth daily., Disp: , Rfl:    traMADol (ULTRAM) 50 MG tablet, tramadol 50 mg tablet  Take 1 tablet every 4-6 hours by oral route as needed., Disp: , Rfl:    verapamil  (CALAN -SR) 180 MG CR tablet, Take by mouth., Disp: , Rfl:    VERAPAMIL  HCL PO, Take 180 mg  by mouth in the morning and at bedtime., Disp: , Rfl:    zolpidem  (AMBIEN ) 10 MG tablet, Take by mouth., Disp: , Rfl:   Vitals   Vitals:   July 10, 2024 2106 Jul 10, 2024 2135 07-10-24 2136  BP:   (!) 178/51  Pulse:  61 62  Resp:  18 (!) 21  Temp:  (!) 97 F (36.1 C)   TempSrc:  Axillary   SpO2:  97% 97%  Weight: 74.4 kg      Body mass index is 26.47 kg/m.   Physical Exam   General: Drowsy, opens eyes to voice, attempts to participate in the examination by following commands but has extremely hypophonic speech HEENT: Normocephalic atraumatic Lungs: Clear Cardiovascular: Median sternotomy incision clean dry Neurological exam Drowsy, opens eyes to voice, follows commands Extremely hypophonic and speech Unable to clearly state correct age of the current month Poor attention concentration Cranial nerves II to XII grossly intact Motor examination with minimal movement of bilateral upper extremities 1-2/5, no movement in the bilateral lower extremities nearly 0/5 Sensation appears intact to light touch as evidenced by grimace to noxious stimulation without any clear attempt of withdrawal Coordination difficult to assess  Labs/Imaging/Neurodiagnostic studies   CBC:  Recent Labs  Lab 07-10-2024 2100 July 10, 2024 2107 07-10-2024 2230  WBC 8.9  --   --   NEUTROABS 5.5  --   --   HGB 9.3* 9.5* 8.8*  HCT 30.3* 28.0* 26.0*  MCV 95.9  --   --   PLT 459*  --   --    Basic Metabolic Panel:  Lab Results  Component Value Date   NA 139 07-10-24   K 4.1 10-Jul-2024   CO2 19 (L) 07/10/2024   GLUCOSE 168 (H) 2024/07/10   BUN 10 07-10-2024   CREATININE 0.90 July 10, 2024   CALCIUM  9.1 Jul 10, 2024   GFRNONAA >60 07/10/2024   Urine Drug Screen:     Component Value Date/Time   LABOPIA NONE DETECTED 10/10/2020 2110   COCAINSCRNUR NONE DETECTED 10/10/2020 2110   LABBENZ NONE DETECTED 10/10/2020 2110   AMPHETMU NONE DETECTED 10/10/2020 2110   THCU NONE DETECTED 10/10/2020 2110   LABBARB NONE  DETECTED 10/10/2020 2110    Alcohol Level     Component Value Date/Time   [facility] <15 2024/07/10 2100   INR  Lab Results  Component Value Date   INR 1.1 07/10/24   APTT  Lab Results  Component Value Date   APTT 32 Jul 10, 2024    CT Head without contrast(Personally reviewed): No acute intracranial abnormality.   CT angio  Head and Neck with contrast(Personally reviewed): No ELVO.  Mixed density atherosclerotic disease at both carotid bifurcations extending into proximal ICAs without hemodynamically significant stenosis.  Unchanged small meningioma anterior to the left frontal lobe  ASSESSMENT   Cherye Gaertner is a 73 y.o. female past history of hypertension, CAD with recent CABG done at an outside facility presenting for evaluation of generalized lethargy and on scene by EMS noted to have aphasia and left-sided weakness On my examination, she was generally overall weak and had poor attention concentration-appeared more encephalopathic than focal. She is on multiple medications with sedating side effects including pain medications such as oxycodone , gabapentin  as well as antidepressant and sleep aids. I suspect side effects from medications/polypharmacy.  Given the recent CABG, small stroke is in the realm of possibility.  Impression: Evaluate for toxic metabolic encephalopathy related to medications versus underlying UTI, evaluate for stroke  RECOMMENDATIONS  I would attempt to get an MRI of the brain without contrast.  Perform full stroke workup only if there is a clinical concern of stroke as her mentation improves.  I was told by the RN that her mentation is improving and she is wiggling toes and fingers now which she was not doing before. I will check a urinalysis, and urinary toxicology screen I think she may need observation and therapy assessments prior to discharge. If MRI is not possible, repeat head CT in about 24 hours to look for any evidence of acute infarction  Neurology  will follow Plan was discussed with Dr. Ismael  ______________________________________________________________________    Signed, Eligio Lav, MD [facility]

## 2024-06-11 NOTE — Telephone Encounter (Signed)
 Portions of this note were dictated using Animal nutritionist.  It has been reviewed for accuracy, but may contain grammatical and clerical errors.   Patient left a voicemail message stating that his wife had fever high blood pressure and weight gain of more than 5 pounds.  Called patient back and she stated that her blood pressures for the last few days were 112 113 and 115 systolically.  Verified to patient that these were correct numbers and she agreed.  Patient did not have high blood pressure will not alter anything.  Patient does not have fever patient confirmed.  Asked patient what their weight was and it was 155.  Weight upon discharge from hospital was 163.  Confirmed with patient that she understood that this was a weight loss of 8 pounds and she agreed that she did not gain any weight.  No noticeable signs of swelling per patient to legs slight swelling to and edema to operative legs patient urged to prop up during the day and at night above heart level.  Patient also had question about pain and stated that she was not giving any pain medication.  Verified to patient that she was prescribed oxycodone  5 mg Robaxin  muscle relaxant for pain and that she could also take Tylenol  up to 1000 mg for pain.  Patient verified that this was correct and would do patient did have a nonproductive cough and she was splinting chest with pillow when she did cough.  Stated the patient to watch out for fever yellow or brown discoloration of production if cough got worse and to let us  know.  Patient urged to take Delsym for cough patient's husband asked if she should still be taking nitroglycerin which she should not patient and husband verbalized understanding to all information and have her contact information if needed.

## 2024-06-12 ENCOUNTER — Observation Stay (HOSPITAL_COMMUNITY)

## 2024-06-12 DIAGNOSIS — R5383 Other fatigue: Secondary | ICD-10-CM

## 2024-06-12 DIAGNOSIS — G928 Other toxic encephalopathy: Secondary | ICD-10-CM | POA: Diagnosis not present

## 2024-06-12 DIAGNOSIS — G9340 Encephalopathy, unspecified: Secondary | ICD-10-CM | POA: Insufficient documentation

## 2024-06-12 DIAGNOSIS — J189 Pneumonia, unspecified organism: Secondary | ICD-10-CM | POA: Insufficient documentation

## 2024-06-12 DIAGNOSIS — R55 Syncope and collapse: Secondary | ICD-10-CM | POA: Insufficient documentation

## 2024-06-12 LAB — BASIC METABOLIC PANEL WITH GFR
Anion gap: 12 (ref 5–15)
BUN: 11 mg/dL (ref 8–23)
CO2: 19 mmol/L — ABNORMAL LOW (ref 22–32)
Calcium: 9.2 mg/dL (ref 8.9–10.3)
Chloride: 107 mmol/L (ref 98–111)
Creatinine, Ser: 0.96 mg/dL (ref 0.44–1.00)
GFR, Estimated: >60 mL/min (ref >60)
Glucose, Bld: 170 mg/dL — ABNORMAL HIGH (ref 70–99)
Potassium: 4.1 mmol/L (ref 3.5–5.1)
Sodium: 138 mmol/L (ref 135–145)

## 2024-06-12 LAB — RESP PANEL BY RT-PCR (RSV, FLU A&B, COVID)  RVPGX2
Influenza A by PCR: NEGATIVE
Influenza B by PCR: NEGATIVE
Resp Syncytial Virus by PCR: NEGATIVE
SARS Coronavirus 2 by RT PCR: NEGATIVE

## 2024-06-12 LAB — PHOSPHORUS: Phosphorus: 3.9 mg/dL (ref 2.5–4.6)

## 2024-06-12 LAB — LACTIC ACID, PLASMA: Lactic Acid, Venous: 2 mmol/L (ref 0.5–1.9)

## 2024-06-12 LAB — CBC
HCT: 30.6 % — ABNORMAL LOW (ref 36.0–46.0)
Hemoglobin: 9.5 g/dL — ABNORMAL LOW (ref 12.0–15.0)
MCH: 28.7 pg (ref 26.0–34.0)
MCHC: 31 g/dL (ref 30.0–36.0)
MCV: 92.4 fL (ref 80.0–100.0)
Platelets: 477 K/uL — ABNORMAL HIGH (ref 150–400)
RBC: 3.31 MIL/uL — ABNORMAL LOW (ref 3.87–5.11)
RDW: 13.8 % (ref 11.5–15.5)
WBC: 7.1 K/uL (ref 4.0–10.5)
nRBC: 0 % (ref 0.0–0.2)

## 2024-06-12 LAB — AMMONIA: Ammonia: 19 umol/L (ref 9–35)

## 2024-06-12 LAB — PROCALCITONIN: Procalcitonin: <0.1 ng/mL

## 2024-06-12 LAB — TSH: TSH: 0.752 u[IU]/mL (ref 0.350–4.500)

## 2024-06-12 LAB — VITAMIN B12: Vitamin B-12: 1124 pg/mL — ABNORMAL HIGH (ref 180–914)

## 2024-06-12 LAB — TROPONIN I (HIGH SENSITIVITY): Troponin I (High Sensitivity): 17 ng/L (ref <18)

## 2024-06-12 LAB — MAGNESIUM: Magnesium: 2.1 mg/dL (ref 1.7–2.4)

## 2024-06-12 MED ORDER — LACTATED RINGERS IV BOLUS
500.0000 mL | Freq: Once | INTRAVENOUS | Status: AC
  Administered 2024-06-12: 500 mL via INTRAVENOUS

## 2024-06-12 MED ORDER — SODIUM CHLORIDE 0.9% FLUSH: 3.0000 mL | Freq: Two times a day (BID) | INTRAVENOUS | Status: DC

## 2024-06-12 MED ORDER — OXYCODONE HCL 5 MG PO TABS: 2.5000 mg | ORAL_TABLET | ORAL | Status: DC | PRN

## 2024-06-12 MED ORDER — POLYETHYLENE GLYCOL 3350 17 G PO PACK: 17.0000 g | PACK | Freq: Every day | ORAL | Status: DC | PRN

## 2024-06-12 MED ORDER — ONDANSETRON HCL 4 MG/2ML IJ SOLN: 4.0000 mg | Freq: Four times a day (QID) | INTRAMUSCULAR | Status: DC | PRN

## 2024-06-12 MED ORDER — AZITHROMYCIN 250 MG PO TABS
500.0000 mg | ORAL_TABLET | Freq: Every day | ORAL | Status: DC
  Administered 2024-06-12: 500 mg via ORAL
  Filled 2024-06-12: qty 2

## 2024-06-12 MED ORDER — GABAPENTIN 300 MG PO CAPS: 300.0000 mg | ORAL_CAPSULE | Freq: Three times a day (TID) | ORAL | Status: DC

## 2024-06-12 MED ORDER — ALBUTEROL SULFATE (2.5 MG/3ML) 0.083% IN NEBU: 2.5000 mg | INHALATION_SOLUTION | RESPIRATORY_TRACT | Status: DC | PRN

## 2024-06-12 MED ORDER — DULOXETINE HCL 30 MG PO CPEP
30.0000 mg | ORAL_CAPSULE | Freq: Every day | ORAL | Status: DC
Start: 2024-06-12 — End: 2024-06-12

## 2024-06-12 MED ORDER — IVABRADINE HCL 5 MG PO TABS
7.5000 mg | ORAL_TABLET | Freq: Two times a day (BID) | ORAL | Status: DC
  Filled 2024-06-12 (×3): qty 1

## 2024-06-12 MED ORDER — IRBESARTAN 300 MG PO TABS: 300.0000 mg | ORAL_TABLET | Freq: Every day | ORAL | Status: DC

## 2024-06-12 MED ORDER — METHOCARBAMOL 500 MG PO TABS: 500.0000 mg | ORAL_TABLET | Freq: Three times a day (TID) | ORAL | Status: DC | PRN

## 2024-06-12 MED ORDER — ACETAMINOPHEN 500 MG PO TABS: 1000.0000 mg | ORAL_TABLET | Freq: Four times a day (QID) | ORAL | Status: DC | PRN

## 2024-06-12 MED ORDER — CLONAZEPAM 0.5 MG PO TABS: 0.5000 mg | ORAL_TABLET | Freq: Two times a day (BID) | ORAL | Status: DC | PRN

## 2024-06-12 MED ORDER — MELATONIN 3 MG PO TABS: 6.0000 mg | ORAL_TABLET | Freq: Every evening | ORAL | Status: DC | PRN

## 2024-06-12 MED ORDER — SODIUM CHLORIDE 0.9 % IV SOLN
1.0000 g | INTRAVENOUS | Status: DC
  Administered 2024-06-12: 1 g via INTRAVENOUS
  Filled 2024-06-12: qty 10

## 2024-06-12 NOTE — TOC Initial Note (Signed)
 Transition of Care [facility]) - Initial/Assessment Note    Patient Details  Name: Rachel Mitchell MRN: [id number] Date of Birth: 02/13/1951  [facility] CM/SW Contact:    Corean JAYSON Canary, RN Phone Number: 06/12/2024, 12:20 PM  Clinical Narrative:                 Patient presented with Aphasia and weakness. Home health recommended and ordered. Reached out to [facility] from [facility] for services, they work with the patients  insurance.  Information placed on AVS  Expected Discharge Plan: Home w Home Health Services Barriers to Discharge: No Barriers Identified   Patient Goals and CMS Choice            Expected Discharge Plan and Services         Expected Discharge Date: 06/12/24                         HH Arranged: PT, OT HH Agency: [facility] Date [facility] Agency Contacted: 06/12/24 Time HH Agency Contacted: 1220 Representative spoke with at [facility] Agency: [place]  Prior Living Arrangements/Services   Lives with:: Spouse                   Activities of Daily Living      Permission Sought/Granted                  Emotional Assessment              Admission diagnosis:  Syncope [R55] Patient Active Problem List   Diagnosis Date Noted   Acute encephalopathy 06/12/2024   CAP (community acquired pneumonia) 06/12/2024   Weakness 10/10/2020   PCP:  [facility]:   [id number] - [place], [place] - [address] AT [facility] [address] [place]  [id number] Phone: [phone] Fax: [phone]     Social Drivers of Health (SDOH) Social History: SDOH Screenings   Food Insecurity: Low Risk  (06/05/2024)   Received from [facility]  Housing: Low Risk  (06/05/2024)   Received from [facility]  Transportation Needs: No Transportation Needs (06/05/2024)   Received from [facility]  Utilities: Low Risk  (06/05/2024)   Received from [facility]  Social Connections:  Unknown (02/18/2022)   Received from [facility]  Tobacco Use: Low Risk  (06/11/2024)   SDOH Interventions:     Readmission Risk Interventions     No data to display

## 2024-06-12 NOTE — Discharge Summary (Addendum)
 Physician Discharge Summary  Rachel Mitchell FMW:[id number] DOB: November 28, 1950 DOA: 06/11/2024  PCP: [facility]  Admit date: 06/11/2024 Discharge date: 06/12/24  Admitted From: Home Disposition: Home Recommendations for Outpatient Follow-up:  Outpatient follow-up with PCP in 1 to 2 weeks Patient is at risk for polypharmacy on Percocet, Robaxin , Klonopin , tramadol, gabapentin  and Ambien .  Recommend weaning off this medication as appropriate. Outpatient follow-up with cardiothoracic surgery as previously planned Check CMP and CBC at follow-up Please follow up on the following pending results: None  Home Health: HH PT and OT Equipment/Devices: No need identified  Discharge Condition: Stable CODE STATUS: Full code    Follow-up Information     [facility]. Schedule an appointment as soon as possible for a visit in 1 week(s).   Contact information: [address] [place] [address] [phone]         [facility] Follow up.   Specialty: Home Health Services Why: Home health agency, they will call you to set up services Contact information: [address] [place] [address] [phone]                 Hospital course 73 y.o. female with hx of CAD with LAD PCI in 12/2023 and recent  CABG X2 LIMA to LAD, SVG to OM1 on 06/03/2024 at [facility], complicated by postoperative A-fib, ABLA, HTN, GERD, who was brought in as code stroke by EMS for left-sided weakness, difficulty speaking, confusion and increased somnolence.   In ED, slightly hypertensive.  Other vital stable.  Hgb 9.3.  Bicarb 19.  Glucose 168.  Otherwise, CBC and CMP without significant finding.  UA and EtOH level negative.  CT head without acute finding.  CT angio head and neck without significant finding.  CXR showed small left pleural effusion and left lung base atelectasis or infiltrate.  VBG basically normal.  Serial troponin basically negative.  Pro-Cal,  COVID-19, influenza and RSV PCR negative.  TSH and ammonia level normal.  B12 1100.  Patient was back to baseline on evaluation by admitting provider.  Neurology recommended MRI brain which was negative for acute finding.  Patient was started on ceftriaxone  and Zithromax  Zithromax  for possible left lung pneumonia and admitted for overnight observation.  The next day, patient remained stable.  Awake and alert and oriented x 4.  No focal neurodeficit.  Orthostatic vitals negative.  No cardiopulmonary, GI, UTI or focal neurosymptoms.  Cleared for discharge from neurology standpoint.  We had extensive discussion about polypharmacy with patient and patient's wife at bedside.  She has been encouraged to cut down on sedating medications and discuss further adjustment to his primary care doctor as soon as possible.  See individual problem list below for more.   Problems addressed during this hospitalization Drug-induced encephalopathy: Iatrogenic from multiple sedating medications including Percocet, Robaxin , Klonopin , gabapentin , tramadol and Ambien .  Extensively discussed with patient and patient's wife about risk and benefit of this medications.  Encouraged to cut down on those medications.  Also encouraged to with her PCP as soon as possible.  Stroke workup negative.  Orthostatic vitals negative.  Polypharmacy: As above.  Community-acquired pneumonia ruled out.  History of CAD and recent CABG: Stable. -Outpatient follow-up with cardiothoracic surgery as previously planned  Generalized weakness -HHPT/OT ordered.      Body mass index is 26.47 kg/m.           Consultations: Neurology  Time spent 35  minutes  Vital signs Vitals:  06/12/24 0830 06/12/24 0845 06/12/24 0900 06/12/24 0907  BP: (!) 134/92  (!) 169/54   Pulse: 74 73 79 71  Temp:    (!) 97.5 F (36.4 C)  Resp: (!) 25 12  18   Weight:      SpO2: 98% 97% 99% 96%  TempSrc:    Oral     Discharge exam  GENERAL: No  apparent distress.  Nontoxic. HEENT: MMM.  Vision and hearing grossly intact.  NECK: Supple.  No apparent JVD.  RESP:  No IWOB.  Fair aeration bilaterally. CVS:  RRR. Heart sounds normal.  ABD/GI/GU: BS+. Abd soft, NTND.  MSK/EXT:  Moves extremities. No apparent deformity. No edema.  SKIN: Sternotomy wound healed nicely. SVG graft site healing. NEURO: Awake and alert. Oriented appropriately.  No apparent focal neuro deficit. PSYCH: Calm. Normal affect.   Discharge Instructions Discharge Instructions     Discharge instructions   Complete by: As directed    It has been a pleasure taking care of you!  You were hospitalized due to generalized weakness, difficulty speaking and altered mental status.  You CT and MRI did not show stroke.  Your symptoms resolved.  We believe your symptoms are related to medications such as oxycodone , clonazepam , zolpidem , gabapentin , tramadol and methocarbamol .   Note that combination of oxycodone , clonazepam , zolpidem , gabapentin , tramadol and methocarbamol  can increase your risk of drowsiness, sedation, constipation, impaired judgment, respiratory depression that could potentially lead to death and impaired balance that could increase risk of fall. We strongly recommend talking to your doctors who prescribe them. We do not recommend driving, operating machinery or other activity that requires similar mental and physical engagement.     Take care,   Increase activity slowly   Complete by: As directed       Allergies as of 06/12/2024       Reactions   Contrast Media [iodinated Contrast Media] Rash, Swelling   Tramadol Rash   Hydrocodone-acetaminophen  Other (See Comments)   Niacin Itching   Shellfish Allergy Rash        Medication List     STOP taking these medications    amoxicillin -clavulanate 875-125 MG tablet Commonly known as: AUGMENTIN        TAKE these medications    clonazePAM  1 MG tablet Commonly known as: KLONOPIN  Take 1 mg by  mouth 2 (two) times daily as needed.   DULoxetine  30 MG capsule Commonly known as: CYMBALTA  Take 30 mg by mouth daily.   estrogens (conjugated) 0.3 MG tablet Commonly known as: PREMARIN Take by mouth.   gabapentin  300 MG capsule Commonly known as: NEURONTIN  Take by mouth.   loratadine 10 MG tablet Commonly known as: CLARITIN Take by mouth.   metoprolol  succinate 25 MG 24 hr tablet Commonly known as: TOPROL -XL Take 25 mg by mouth daily.   Multi-Vitamin tablet Take 1 tablet by mouth daily.   olmesartan 40 MG tablet Commonly known as: BENICAR Take by mouth.   omeprazole 40 MG capsule Commonly known as: PRILOSEC Take by mouth.   ondansetron  4 MG disintegrating tablet Commonly known as: Zofran  ODT Take 1 tablet (4 mg total) by mouth every 8 (eight) hours as needed for nausea or vomiting.   pravastatin  40 MG tablet Commonly known as: PRAVACHOL  Take 40 mg by mouth daily.   traMADol 50 MG tablet Commonly known as: ULTRAM tramadol 50 mg tablet  Take 1 tablet every 4-6 hours by oral route as needed.   VERAPAMIL  HCL PO Take 180 mg by mouth  in the morning and at bedtime.   verapamil  180 MG CR tablet Commonly known as: CALAN -SR Take by mouth.   zolpidem  10 MG tablet Commonly known as: AMBIEN  Take by mouth.         Procedures/Studies:   MR BRAIN WO CONTRAST Result Date: 06/12/2024 EXAM: MRI BRAIN WITHOUT CONTRAST 06/11/2024 11:54:00 PM TECHNIQUE: Multiplanar multisequence MRI of the head/brain was performed without the administration of intravenous contrast. COMPARISON: 10/11/2020 CLINICAL HISTORY: Neuro deficit, acute, stroke suspected. FINDINGS: BRAIN AND VENTRICLES: No acute infarct. No intracranial hemorrhage. No mass. No midline shift. No hydrocephalus. The sella is unremarkable. Normal flow voids. Multifocal hyperintense T2-weighted signal within the cerebral white matter, most commonly due to chronic small vessel disease. ORBITS: No acute abnormality. SINUSES  AND MASTOIDS: No acute abnormality. BONES AND SOFT TISSUES: Normal marrow signal. No acute soft tissue abnormality. IMPRESSION: 1. No acute intracranial abnormality. 2. Multifocal hyperintense T2-weighted signal within the cerebral white matter, most commonly due to chronic small vessel disease. Electronically signed by: Franky Stanford MD 06/12/2024 12:12 AM EDT RP Workstation: [id number]   DG Chest Port 1 View Result Date: 06/11/2024 CLINICAL DATA:  Altered mental status. EXAM: PORTABLE CHEST 1 VIEW COMPARISON:  Chest radiograph dated 06/06/2024 FINDINGS: A small left pleural effusion and left lung base atelectasis or infiltrate. The right lung is clear. No pneumothorax. Stable cardiac silhouette. Median sternotomy wires. No acute osseous pathology. Left clavicular fixation hardware. IMPRESSION: Small left pleural effusion and left lung base atelectasis or infiltrate. Electronically Signed   By: Vanetta Chou M.D.   On: 06/11/2024 22:29   CT ANGIO HEAD NECK W WO CM (CODE STROKE) Result Date: 06/11/2024 EXAM: CT HEAD WITHOUT CTA HEAD AND NECK WITH AND WITHOUT 06/11/2024 09:22:31 PM TECHNIQUE: CTA of the head and neck was performed with and without the administration of intravenous contrast. Noncontrast CT of the head with reconstructed 2-D images are also provided for review. Multiplanar 2D and/or 3D reformatted images are provided for review. Automated exposure control, iterative reconstruction, and/or weight based adjustment of the mA/kV was utilized to reduce the radiation dose to as low as reasonably achievable. COMPARISON: 10/11/2020 brain MRI CLINICAL HISTORY: Neuro deficit, acute, stroke suspected. Slight delay d/t contrast allergy, solumedrol ad benadryl  were given on the table per dr voncile. FINDINGS: CT HEAD: BRAIN AND VENTRICLES: No acute intracranial hemorrhage. No mass effect. No midline shift. No extra-axial fluid collection. No evidence of acute infarct. No hydrocephalus. Small meningioma anterior  to the left temporal lobe, unchanged. ORBITS: No acute abnormality. SINUSES AND MASTOIDS: No acute abnormality. CTA NECK: AORTIC ARCH AND ARCH VESSELS: No dissection or arterial injury. No significant stenosis of the brachiocephalic or subclavian arteries. 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 CERVICAL VERTEBRAL ARTERIES: No dissection, arterial injury, or significant stenosis. LUNGS AND MEDIASTINUM: Unremarkable. SOFT TISSUES: No acute abnormality. BONES: No acute abnormality. CTA HEAD: ANTERIOR CIRCULATION: No significant stenosis of the internal carotid arteries. No significant stenosis of the anterior cerebral arteries. No significant stenosis of the middle cerebral arteries. No aneurysm. POSTERIOR CIRCULATION: No significant stenosis of the posterior cerebral arteries. No significant stenosis of the basilar artery. No significant stenosis of the vertebral arteries. No aneurysm. OTHER: No dural venous sinus thrombosis on this non-dedicated study. IMPRESSION: 1. No acute findings. 2. 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  3. Unchanged small meningioma anterior to the left temporal lobe. Electronically signed by: Franky Stanford MD 06/11/2024 09:38 PM EDT RP Workstation: [id number]   CT HEAD CODE STROKE WO CONTRAST Result Date: 06/11/2024 EXAM: CT HEAD WITHOUT 06/11/2024 09:08:40 PM TECHNIQUE: CT of the head was performed without the administration of intravenous contrast. Automated exposure control, iterative reconstruction, and/or weight based adjustment of the mA/kV was utilized to reduce the radiation dose to as low as reasonably achievable. COMPARISON: 10/10/2020 CLINICAL HISTORY: Neuro deficit, acute, stroke  suspected. FINDINGS: BRAIN AND VENTRICLES: No acute intracranial hemorrhage. No mass effect or midline shift. No extra-axial fluid collection. No evidence of acute infarct. No hydrocephalus. ASPECTS is 10. ORBITS: No acute abnormality. SINUSES AND MASTOIDS: No acute abnormality. SOFT TISSUES AND SKULL: No acute skull fracture. No acute soft tissue abnormality. IMPRESSION: 1. No acute intracranial abnormality. ASPECTS is 10. 2. Findings communicated to Dr. Ashish Arora at 9:19 pm on 06/11/24. Electronically signed by: Franky Stanford MD 06/11/2024 09:22 PM EDT RP Workstation: [id number]       The results of significant diagnostics from this hospitalization (including imaging, microbiology, ancillary and laboratory) are listed below for reference.     Microbiology: Recent Results (from the past 240 hours)  Resp panel by RT-PCR (RSV, Flu A&B, Covid) Anterior Nasal Swab     Status: None   Collection Time: 06/12/24  3:08 AM   Specimen: Anterior Nasal Swab  Result Value Ref Range Status   SARS Coronavirus 2 by RT PCR NEGATIVE NEGATIVE Final   Influenza A by PCR NEGATIVE NEGATIVE Final   Influenza B by PCR NEGATIVE NEGATIVE Final    Comment: (NOTE) The Xpert Xpress SARS-CoV-2/FLU/RSV plus assay is intended as an aid in the diagnosis of influenza from Nasopharyngeal swab specimens and should not be used as a sole basis for treatment. Nasal washings and aspirates are unacceptable for Xpert Xpress SARS-CoV-2/FLU/RSV testing.  Fact Sheet for Patients: BloggerCourse.com  Fact Sheet for Healthcare Providers: SeriousBroker.it  This test is not yet approved or cleared by the [place]  FDA and has been authorized for detection and/or diagnosis of SARS-CoV-2 by FDA under an Emergency Use Authorization (EUA). This EUA will remain in effect (meaning this test can be used) for the duration of the COVID-19 declaration under Section 564(b)(1) of the Act,  21 U.S.C. section 360bbb-3(b)(1), unless the authorization is terminated or revoked.     Resp Syncytial Virus by PCR NEGATIVE NEGATIVE Final    Comment: (NOTE) Fact Sheet for Patients: BloggerCourse.com  Fact Sheet for Healthcare Providers: SeriousBroker.it  This test is not yet approved or cleared by the [place]  FDA and has been authorized for detection and/or diagnosis of SARS-CoV-2 by FDA under an Emergency Use Authorization (EUA). This EUA will remain in effect (meaning this test can be used) for the duration of the COVID-19 declaration under Section 564(b)(1) of the Act, 21 U.S.C. section 360bbb-3(b)(1), unless the authorization is terminated or revoked.  Performed at [facility], [address], [place], [place] [address]      Labs:  CBC: Recent Labs  Lab 06/11/24 2100 06/11/24 2107 06/11/24 2230 06/12/24 0447  WBC 8.9  --   --  7.1  NEUTROABS 5.5  --   --   --   HGB 9.3* 9.5* 8.8* 9.5*  HCT 30.3* 28.0* 26.0* 30.6*  MCV 95.9  --   --  92.4  PLT 459*  --   --  477*   BMP &GFR Recent Labs  Lab  06/11/24 2100 06/11/24 2107 06/11/24 2230 06/12/24 0447  NA 135 139 139 138  K 4.0 4.1 4.1 4.1  CL 105 106  --  107  CO2 19*  --   --  19*  GLUCOSE 168* 168*  --  170*  BUN 10 10  --  11  CREATININE 0.99 0.90  --  0.96  CALCIUM  9.1  --   --  9.2  MG  --   --   --  2.1  PHOS  --   --   --  3.9   CrCl cannot be calculated (Unknown ideal weight.). Liver & Pancreas: Recent Labs  Lab 06/11/24 2100  AST 17  ALT 11  ALKPHOS 69  BILITOT 0.7  PROT 6.7  ALBUMIN 3.1*   No results for input(s): LIPASE, AMYLASE in the last 168 hours. Recent Labs  Lab 06/11/24 2225 06/12/24 0447  AMMONIA 14 19   Diabetic: No results for input(s): HGBA1C in the last 72 hours. Recent Labs  Lab 06/11/24 2103  GLUCAP 160*   Cardiac Enzymes: No results for input(s): CKTOTAL, CKMB, CKMBINDEX,  TROPONINI in the last 168 hours. No results for input(s): PROBNP in the last 8760 hours. Coagulation Profile: Recent Labs  Lab 06/11/24 2100  INR 1.1   Thyroid  Function Tests: Recent Labs    06/12/24 0447  TSH 0.752   Lipid Profile: No results for input(s): CHOL, HDL, LDLCALC, TRIG, CHOLHDL, LDLDIRECT in the last 72 hours. Anemia Panel: Recent Labs    06/12/24 0447  VITAMINB12 1,124*   Urine analysis:    Component Value Date/Time   COLORURINE STRAW (A) 06/11/2024 2146   APPEARANCEUR CLEAR 06/11/2024 2146   LABSPEC 1.013 06/11/2024 2146   PHURINE 7.0 06/11/2024 2146   GLUCOSEU NEGATIVE 06/11/2024 2146   HGBUR NEGATIVE 06/11/2024 2146   BILIRUBINUR NEGATIVE 06/11/2024 2146   KETONESUR NEGATIVE 06/11/2024 2146   PROTEINUR NEGATIVE 06/11/2024 2146   NITRITE NEGATIVE 06/11/2024 2146   LEUKOCYTESUR NEGATIVE 06/11/2024 2146   Sepsis Labs: Invalid input(s): PROCALCITONIN, LACTICIDVEN   SIGNED:  Arling Cerone T Drea Jurewicz, MD  [facility] 06/12/2024, 9:49 PM

## 2024-06-12 NOTE — ED Notes (Signed)
 Please call [facility] number as primary.

## 2024-06-12 NOTE — ED Notes (Signed)
 Provider notified to speak with patient before DC as per request.

## 2024-06-12 NOTE — ED Notes (Signed)
 PT and/or OT at bedside.

## 2024-06-12 NOTE — Evaluation (Signed)
 Occupational Therapy Evaluation Patient Details Name: Rachel Mitchell MRN: [id number] DOB: Jan 13, 1951 Today's Date: 06/12/2024   History of Present Illness   Pt is 73 year old presented to [facility] on  06/11/24 for aphasia and lt weakness. CT and MRI negative and pt at baseline. Possible sedating meds as the cause. Pt with recent CABG at [facility] on 8/25 with post op afib. PMH - HTN, CAD, PCI     Clinical Impressions Pt admitted for above, PTA pt reports having some ADL assist since having her CABG procedure and ambulating with HH assist from her husband. Pt currently presenting close to baseline, no cognitive deficits noted and pt speaking without difficulty. She verbally recalls all of her sternal precautions and demonstrated maintenance of them during mobility. Pt needing min A to setup A for ADLs and CGA for mobility. OT to continue following pt acutely to promote mobility consistent with post procedure requirements while she is admitted. Patient would benefit from post acute Home OT services to help maximize functional independence in natural environment      If plan is discharge home, recommend the following:   A little help with walking and/or transfers;A little help with bathing/dressing/bathroom;Assistance with cooking/housework     Functional Status Assessment   Patient has not had a recent decline in their functional status     Equipment Recommendations   None recommended by OT     Recommendations for Other Services         Precautions/Restrictions   Precautions Precautions: Sternal;Fall Precaution Booklet Issued: No Recall of Precautions/Restrictions: Intact Precaution/Restrictions Comments: verbally recalls Sternal precautions Restrictions Other Position/Activity Restrictions: sternal     Mobility Bed Mobility Overal bed mobility: Modified Independent             General bed mobility comments: maintained sternal precautions    Transfers Overall  transfer level: Needs assistance Equipment used: None Transfers: Sit to/from Stand Sit to Stand: Supervision                  Balance Overall balance assessment: Needs assistance Sitting-balance support: No upper extremity supported, Feet supported Sitting balance-Leahy Scale: Good     Standing balance support: No upper extremity supported, During functional activity Standing balance-Leahy Scale: Fair                             ADL either performed or assessed with clinical judgement   ADL       Grooming: Sitting;Set up           Upper Body Dressing : Minimal assistance;Sitting   Lower Body Dressing: Supervision/safety;Sitting/lateral leans (figure four position)   Toilet Transfer: Contact guard assist                   Vision   Vision Assessment?: No apparent visual deficits;Yes Eye Alignment: Within Functional Limits Ocular Range of Motion: Within Functional Limits Alignment/Gaze Preference: Within Defined Limits Tracking/Visual Pursuits: Able to track stimulus in all quads without difficulty Saccades: Within functional limits Convergence: Within functional limits Visual Fields: No apparent deficits     Perception Perception: Within Functional Limits       Praxis Praxis: WFL       Pertinent Vitals/Pain Pain Assessment Pain Assessment: No/denies pain     Extremity/Trunk Assessment Upper Extremity Assessment Upper Extremity Assessment: Overall WFL for tasks assessed (strength similar bilaterally, deferred MMT given sternal precautions)   Lower Extremity Assessment Lower Extremity Assessment: Generalized weakness  Communication Communication Communication: No apparent difficulties   Cognition Arousal: Alert Behavior During Therapy: WFL for tasks assessed/performed Cognition: No apparent impairments                               Following commands: Intact       Cueing  General Comments   Cueing  Techniques: Verbal cues  VSS   Exercises     Shoulder Instructions      Home Living Family/patient expects to be discharged to:: Private residence Living Arrangements: Spouse/significant other Available Help at Discharge: Family;Available 24 hours/day Type of Home: House Home Access: Stairs to enter [employer] of Steps: 3 Entrance Stairs-Rails: Right Home Layout: One level     Bathroom Shower/Tub: Walk-in shower         Home Equipment: Cane - quad;Cane - single Librarian, academic (2 wheels);Electric scooter          Prior Functioning/Environment Prior Level of Function : Needs assist       Physical Assist : Mobility (physical);ADLs (physical) Mobility (physical): Gait;Stairs   Mobility Comments: Since DC after CABG has been amb with hand held assist of husband ADLs Comments: Assist since CABG    OT Problem List: Decreased strength;Impaired balance (sitting and/or standing)   OT Treatment/Interventions: Self-care/ADL training;Therapeutic exercise;Patient/family education;Visual/perceptual remediation/compensation;Therapeutic activities      OT Goals(Current goals can be found in the care plan section)   Acute Rehab OT Goals Patient Stated Goal: go home OT Goal Formulation: With patient Time For Goal Achievement: 06/26/24 Potential to Achieve Goals: Good ADL Goals Pt Will Perform Grooming: with modified independence;standing Pt Will Perform Lower Body Dressing: with modified independence;sit to/from stand Pt Will Transfer to Toilet: with modified independence;ambulating Pt Will Perform Toileting - Clothing Manipulation and hygiene: with modified independence;sit to/from stand   OT Frequency:  Min 2X/week    Co-evaluation              AM-PAC OT 6 Clicks Daily Activity     Outcome Measure Help from another person eating meals?: None Help from another person taking care of personal grooming?: A Little Help from another person  toileting, which includes using toliet, bedpan, or urinal?: A Little Help from another person bathing (including washing, rinsing, drying)?: A Little Help from another person to put on and taking off regular upper body clothing?: A Little Help from another person to put on and taking off regular lower body clothing?: A Little 6 Click Score: 19   End of Session Nurse Communication: Mobility status  Activity Tolerance: Patient tolerated treatment well Patient left: in bed;with call bell/phone within reach  OT Visit Diagnosis: Unsteadiness on feet (R26.81);Cognitive communication deficit (R41.841)                Time: 9076-9061 OT Time Calculation (min): 15 min Charges:  OT General Charges $OT Visit: 1 Visit OT Evaluation $OT Eval Low Complexity: 1 Low  06/12/2024  AB, OTR/L  Acute Rehabilitation Services  Office: [phone]   Curtistine JONETTA Das 06/12/2024, 10:34 AM

## 2024-06-12 NOTE — Evaluation (Signed)
 Physical Therapy Evaluation Patient Details Name: Rachel Mitchell MRN: [id number] DOB: 1951/04/07 Today's Date: 06/12/2024  History of Present Illness  Pt is 73 year old presented to [facility] on  06/11/24 for aphasia and lt weakness. CT and MRI negative and pt at baseline. Possible sedating meds as the cause. Pt with recent CABG at [facility] on 8/25 with post op afib. PMH - HTN, CAD, PCI  Clinical Impression  Pt presents to PT with slight unsteadiness with gait and generalized weakness but no focal deficits. Recommend return home with husband and HHPT to help progress pt back to higher level of independence.         If plan is discharge home, recommend the following: Assistance with cooking/housework;A little help with bathing/dressing/bathroom;A little help with walking and/or transfers;Assist for transportation;Help with stairs or ramp for entrance   Can travel by private vehicle        Equipment Recommendations None recommended by PT  Recommendations for Other Services       Functional Status Assessment Patient has had a recent decline in their functional status and demonstrates the ability to make significant improvements in function in a reasonable and predictable amount of time.     Precautions / Restrictions Precautions Precautions: Sternal;Fall Restrictions Other Position/Activity Restrictions: sternal      Mobility  Bed Mobility Overal bed mobility: Modified Independent             General bed mobility comments: Incr time. Followed sternal precautions    Transfers Overall transfer level: Needs assistance Equipment used: None Transfers: Sit to/from Stand Sit to Stand: Contact guard assist           General transfer comment: Assist for safety    Ambulation/Gait Ambulation/Gait assistance: Contact guard assist Gait Distance (Feet): 125 Feet Assistive device: 1 person hand held assist Gait Pattern/deviations: Step-through pattern, Decreased stride length, Drifts  right/left Gait velocity: decr Gait velocity interpretation: 1.31 - 2.62 ft/sec, indicative of limited community ambulator   General Gait Details: Slightly unsteady at times with pt preferring single UE support at times  Stairs            Wheelchair Mobility     Tilt Bed    Modified Rankin (Stroke Patients Only)       Balance Overall balance assessment: Needs assistance Sitting-balance support: No upper extremity supported, Feet supported Sitting balance-Leahy Scale: Good     Standing balance support: No upper extremity supported, During functional activity Standing balance-Leahy Scale: Fair                               Pertinent Vitals/Pain Pain Assessment Pain Assessment: No/denies pain    Home Living Family/patient expects to be discharged to:: Private residence Living Arrangements: Spouse/significant other Available Help at Discharge: Family;Available 24 hours/day Type of Home: House Home Access: Stairs to enter Entrance Stairs-Rails: Right Entrance Stairs-Number of Steps: 3   Home Layout: One level Home Equipment: Cane - quad;Cane - single Librarian, academic (2 wheels);Electric scooter      Prior Function Prior Level of Function : Needs assist       Physical Assist : Mobility (physical);ADLs (physical) Mobility (physical): Gait;Stairs   Mobility Comments: Since DC after CABG has been amb with hand held assist of husband ADLs Comments: Assist since CABG     Extremity/Trunk Assessment   Upper Extremity Assessment Upper Extremity Assessment: Defer to OT evaluation    Lower Extremity Assessment Lower Extremity  Assessment: Generalized weakness       Communication   Communication Communication: No apparent difficulties    Cognition Arousal: Alert Behavior During Therapy: WFL for tasks assessed/performed   PT - Cognitive impairments: No apparent impairments                         Following commands: Intact        Cueing Cueing Techniques: Verbal cues     General Comments General comments (skin integrity, edema, etc.): VSS on RA. Orhostatics negative. See flow sheet for details.    Exercises     Assessment/Plan    PT Assessment Patient needs continued PT services  PT Problem List Decreased strength;Decreased balance;Decreased mobility       PT Treatment Interventions DME instruction;Gait training;Stair training;Functional mobility training;Therapeutic activities;Therapeutic exercise;Balance training;Patient/family education    PT Goals (Current goals can be found in the Care Plan section)  Acute Rehab PT Goals Patient Stated Goal: return home PT Goal Formulation: With patient Time For Goal Achievement: 06/26/24 Potential to Achieve Goals: Good    Frequency Min 2X/week     Co-evaluation               AM-PAC PT 6 Clicks Mobility  Outcome Measure Help needed turning from your back to your side while in a flat bed without using bedrails?: None Help needed moving from lying on your back to sitting on the side of a flat bed without using bedrails?: None Help needed moving to and from a bed to a chair (including a wheelchair)?: A Little Help needed standing up from a chair using your arms (e.g., wheelchair or bedside chair)?: A Little Help needed to walk in hospital room?: A Little Help needed climbing 3-5 steps with a railing? : A Little 6 Click Score: 20    End of Session   Activity Tolerance: Patient tolerated treatment well Patient left: in bed;with call bell/phone within reach;Other (comment) (Sitting edge of stretcher with physician present) Nurse Communication: Mobility status;Other (comment) (purewick out) PT Visit Diagnosis: Unsteadiness on feet (R26.81);Muscle weakness (generalized) (M62.81)    Time: 9182-9159 PT Time Calculation (min) (ACUTE ONLY): 23 min   Charges:   PT Evaluation $PT Eval Moderate Complexity: 1 Mod PT Treatments $Gait Training: 8-22  mins PT General Charges $$ ACUTE PT VISIT: 1 Visit         [facility] PT Acute Rehabilitation Services Office [phone]   Rodgers ORN [facility] 06/12/2024, 8:52 AM

## 2024-06-12 NOTE — H&P (Signed)
 History and Physical    Rachel Mitchell FMW:[id number] DOB: 06-18-1951 DOA: 06/11/2024  PCP: [facility]   Patient coming from: Home   Chief Complaint:  Chief Complaint  Patient presents with   Aphasia    HPI:  Rachel Mitchell is a 73 y.o. female with hx of CAD with LAD PCI 3/'25 and recent history of CABG X2 LIMA to LAD, SVG to OM1 on 8/25 at [facility], complicated by postoperative A-fib, ABLA, additional history of HTN, GERD, who was brought in as code stroke by EMS for left-sided weakness    Reportedly patient has been deconditioned since her recent hospitalization and has not been very active.  Today was even more somnolent and had an episode where she got up appeared confused sat down on the couch and listed over to one side so family called EMS.   On my interview patient is back at her baseline. She recalls the event that occurred earlier. She says she was sitting on the cough, not supposed to get up without assistance but ended up getting up she thinks. After sitting back down on the couch felt that she couldn't move her extremities (all extremities, nothing focal). Was trying to speak but was unable to. Also reports seeing smoke in her vision when looking at EMS peoples faces. Denies one sided vision change. At present she feels back at her baseline. Reports that over the past 2 days has had a cough which has been nonproductive. She denies any changes in medication, since her hospital discharge. She has soreness in her chest with coughing and is using IS at home. Denies any other change in chest pain. No shortness of breath.  She has been constipated but is adamant she does not want laxatives in hospital    Review of Systems:  ROS complete and negative except as marked above   Allergies  Allergen Reactions   Contrast Media [Iodinated Contrast Media] Rash and Swelling   Tramadol Rash   Hydrocodone-Acetaminophen  Other (See Comments)   Niacin Itching   Shellfish Allergy  Rash    Prior to Admission medications   Medication Sig Start Date End Date Taking? Authorizing Provider  amoxicillin -clavulanate (AUGMENTIN ) 875-125 MG tablet Take 1 tablet by mouth every 12 (twelve) hours. 10/02/20   Patel, Shalyn, PA-C  clonazePAM  (KLONOPIN ) 1 MG tablet Take 1 mg by mouth 2 (two) times daily as needed. 09/29/20   [provider]  DULoxetine  (CYMBALTA ) 30 MG capsule Take 30 mg by mouth daily. 09/05/20   [provider]  estrogens, conjugated, (PREMARIN) 0.3 MG tablet Take by mouth. 03/06/18   [provider]  gabapentin  (NEURONTIN ) 300 MG capsule Take by mouth.    [provider]  loratadine (CLARITIN) 10 MG tablet Take by mouth.    [provider]  metoprolol  succinate (TOPROL -XL) 25 MG 24 hr tablet Take 25 mg by mouth daily. 08/17/20   [provider]  Multiple Vitamin (MULTI-VITAMIN) tablet Take 1 tablet by mouth daily.    [provider]  olmesartan (BENICAR) 40 MG tablet Take by mouth.    [provider]  omeprazole (PRILOSEC) 40 MG capsule Take by mouth.    [provider]  ondansetron  (ZOFRAN  ODT) 4 MG disintegrating tablet Take 1 tablet (4 mg total) by mouth every 8 (eight) hours as needed for nausea or vomiting. 10/02/20   Patel, Shalyn, PA-C  pravastatin  (PRAVACHOL ) 40 MG tablet Take 40 mg by mouth daily. 05/24/20   [provider]  traMADol (  ULTRAM) 50 MG tablet tramadol 50 mg tablet  Take 1 tablet every 4-6 hours by oral route as needed.    [provider]  verapamil  (CALAN -SR) 180 MG CR tablet Take by mouth. 09/16/20   [provider]  VERAPAMIL  HCL PO Take 180 mg by mouth in the morning and at bedtime.    [provider]  zolpidem  (AMBIEN ) 10 MG tablet Take by mouth. 09/30/14   [provider]    Past Medical History:  Diagnosis Date   Hypertension     Past Surgical History:  Procedure Laterality Date   ABDOMINAL HYSTERECTOMY        reports that she has never smoked. She has never used smokeless tobacco. She reports that she does not drink alcohol and does not use drugs.  No family history on file.   Physical Exam: Vitals:   06/11/24 2315 06/11/24 2316 06/12/24 0024 06/12/24 0229  BP: (!) 159/52  116/85   Pulse: 60  66   Resp: 20  20   Temp:    98.7 F (37.1 C)  TempSrc:    Oral  SpO2: 95% 97% 97%   Weight:        Gen: Awake, alert, NAD   CV: Regular, normal S1, S2, no murmurs. Midline sternotomy is well healing, no surrounding signs of infection, no discharge.  Resp: Normal WOB, Rales in the L base. Splinting with coughing  Abd: Flat, normoactive, nontender MSK: Symmetric, no edema  Skin: No rashes or lesions to exposed skin  Neuro: Alert and interactive  Psych: euthymic, appropriate    Data review:   Labs reviewed, notable for:   Bicarb 19, AG 11 Other chemistries unremarkable VBG is 7.4/31 High-sensitivity Trop 18 -> 17 WBC 8 Hemoglobin 9.3 stable from last discharge UA negative for infection Alcohol negative, UDS pending  Micro:  Results for orders placed or performed during the hospital encounter of 10/10/20  SARS CORONAVIRUS 2 (TAT 6-24 HRS) Nasopharyngeal Nasopharyngeal Swab     Status: None   Collection Time: 10/10/20  8:05 PM   Specimen: Nasopharyngeal Swab  Result Value Ref Range Status   SARS Coronavirus 2 NEGATIVE NEGATIVE Final    Comment: (NOTE) SARS-CoV-2 target nucleic acids are NOT DETECTED.  The SARS-CoV-2 RNA is generally detectable in upper and lower respiratory specimens during the acute phase of infection. Negative results do not preclude SARS-CoV-2 infection, do not rule out co-infections with other pathogens, and should not be used as the sole basis for treatment or other patient management decisions. Negative results must be combined with clinical observations, patient history, and epidemiological information. The expected result is Negative.  Fact Sheet  for Patients: HairSlick.no  Fact Sheet for Healthcare Providers: quierodirigir.com  This test is not yet approved or cleared by the [place]  FDA and  has been authorized for detection and/or diagnosis of SARS-CoV-2 by FDA under an Emergency Use Authorization (EUA). This EUA will remain  in effect (meaning this test can be used) for the duration of the COVID-19 declaration under Se ction 564(b)(1) of the Act, 21 U.S.C. section 360bbb-3(b)(1), unless the authorization is terminated or revoked sooner.  Performed at [facility], [address] [place], [place] [address]     Imaging reviewed:  MR BRAIN WO CONTRAST Result Date: 06/12/2024 EXAM: MRI BRAIN WITHOUT CONTRAST 06/11/2024 11:54:00 PM TECHNIQUE: Multiplanar multisequence MRI of the head/brain was performed without the administration of intravenous contrast. COMPARISON: 10/11/2020 CLINICAL HISTORY: Neuro deficit, acute, stroke suspected. FINDINGS:  BRAIN AND VENTRICLES: No acute infarct. No intracranial hemorrhage. No mass. No midline shift. No hydrocephalus. The sella is unremarkable. Normal flow voids. Multifocal hyperintense T2-weighted signal within the cerebral white matter, most commonly due to chronic small vessel disease. ORBITS: No acute abnormality. SINUSES AND MASTOIDS: No acute abnormality. BONES AND SOFT TISSUES: Normal marrow signal. No acute soft tissue abnormality. IMPRESSION: 1. No acute intracranial abnormality. 2. Multifocal hyperintense T2-weighted signal within the cerebral white matter, most commonly due to chronic small vessel disease. Electronically signed by: Franky Stanford MD 06/12/2024 12:12 AM EDT RP Workstation: [id number]   DG Chest Port 1 View Result Date: 06/11/2024 CLINICAL DATA:  Altered mental status. EXAM: PORTABLE CHEST 1 VIEW COMPARISON:  Chest radiograph dated 06/06/2024 FINDINGS: A small left pleural effusion and left lung base atelectasis  or infiltrate. The right lung is clear. No pneumothorax. Stable cardiac silhouette. Median sternotomy wires. No acute osseous pathology. Left clavicular fixation hardware. IMPRESSION: Small left pleural effusion and left lung base atelectasis or infiltrate. Electronically Signed   By: Vanetta Chou M.D.   On: 06/11/2024 22:29   CT ANGIO HEAD NECK W WO CM (CODE STROKE) Result Date: 06/11/2024 EXAM: CT HEAD WITHOUT CTA HEAD AND NECK WITH AND WITHOUT 06/11/2024 09:22:31 PM TECHNIQUE: CTA of the head and neck was performed with and without the administration of intravenous contrast. Noncontrast CT of the head with reconstructed 2-D images are also provided for review. Multiplanar 2D and/or 3D reformatted images are provided for review. Automated exposure control, iterative reconstruction, and/or weight based adjustment of the mA/kV was utilized to reduce the radiation dose to as low as reasonably achievable. COMPARISON: 10/11/2020 brain MRI CLINICAL HISTORY: Neuro deficit, acute, stroke suspected. Slight delay d/t contrast allergy, solumedrol ad benadryl  were given on the table per dr voncile. FINDINGS: CT HEAD: BRAIN AND VENTRICLES: No acute intracranial hemorrhage. No mass effect. No midline shift. No extra-axial fluid collection. No evidence of acute infarct. No hydrocephalus. Small meningioma anterior to the left temporal lobe, unchanged. ORBITS: No acute abnormality. SINUSES AND MASTOIDS: No acute abnormality. CTA NECK: AORTIC ARCH AND ARCH VESSELS: No dissection or arterial injury. No significant stenosis of the brachiocephalic or subclavian arteries. 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 CERVICAL VERTEBRAL ARTERIES: No dissection, arterial injury, or significant stenosis. LUNGS AND MEDIASTINUM: Unremarkable. SOFT TISSUES: No acute  abnormality. BONES: No acute abnormality. CTA HEAD: ANTERIOR CIRCULATION: No significant stenosis of the internal carotid arteries. No significant stenosis of the anterior cerebral arteries. No significant stenosis of the middle cerebral arteries. No aneurysm. POSTERIOR CIRCULATION: No significant stenosis of the posterior cerebral arteries. No significant stenosis of the basilar artery. No significant stenosis of the vertebral arteries. No aneurysm. OTHER: No dural venous sinus thrombosis on this non-dedicated study. IMPRESSION: 1. No acute findings. 2. 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 3. Unchanged small meningioma anterior to the left temporal lobe. Electronically signed by: Franky Stanford MD 06/11/2024 09:38 PM EDT RP Workstation: [id number]   CT HEAD CODE STROKE WO CONTRAST Result Date: 06/11/2024 EXAM: CT HEAD WITHOUT 06/11/2024 09:08:40 PM TECHNIQUE: CT of the head was performed without the administration of intravenous contrast. Automated exposure control, iterative reconstruction, and/or weight based adjustment of the mA/kV was utilized to reduce the radiation dose to as low as reasonably achievable. COMPARISON: 10/10/2020 CLINICAL HISTORY: Neuro  deficit, acute, stroke suspected. FINDINGS: BRAIN AND VENTRICLES: No acute intracranial hemorrhage. No mass effect or midline shift. No extra-axial fluid collection. No evidence of acute infarct. No hydrocephalus. ASPECTS is 10. ORBITS: No acute abnormality. SINUSES AND MASTOIDS: No acute abnormality. SOFT TISSUES AND SKULL: No acute skull fracture. No acute soft tissue abnormality. IMPRESSION: 1. No acute intracranial abnormality. ASPECTS is 10. 2. Findings communicated to Dr. Ashish Arora at 9:19 pm on 06/11/24. Electronically signed by: Franky Stanford MD 06/11/2024 09:22 PM EDT RP Workstation: [id number]    EKG:  Personally  reviewed, sinus rhythm, LVH, inferior Q wave, anterolateral mild ST depression and biphasic T wave  ED Course:  Arrived as a code stroke, was evaluated by neurology, underwent CT head and CTA head and neck which was negative for acute abnormality, no hemodynamically significant stenosis/LVO.  MRI brain ultimately negative for stroke   Assessment/Plan:  73 y.o. female with hx CAD with LAD PCI 3/'25 and recent history of CABG X2 LIMA to LAD, SVG to OM1 on 8/25 at [facility], complicated by postoperative A-fib, ABLA, additional history of HTN, GERD, who was brought in as code stroke by EMS for left-sided weakness; ultimately evaluation negative for stroke. Suspected metabolic encephalopathy, possible in setting of sedating medications v possible CAP.   Encephalopathy, suspect metabolic in setting of sedating medication versus infection Presented as code stroke by EMS, LKWT 8 PM. Evaluated by Neurology, and underwent CT Head, not TNK candidate with nonfocal exam, recent medical history. CTA H/N no hemodynamically significant stenosis/LVO; there is atherosclerotic disease at carotid bifurcation extending to proximal ICA bilaterally.  MRI brain negative for stroke, demonstrating likely small vessel ischemic changes.  Labs are relatively unremarkable for cause of encephalopathy.  Is taking sedating medication after recent CABG.  And otherwise has left base infiltrate possible. - Hold sedating medications for now resume reduced dose of clonazepam , gabapentin , oxycodone , methocarbamol  in the morning. Hold ambien   - Treatment of possible CAP per below - Check TSH, B12, ammonia - PT/OT evaluation - Delirium precautions/fall precautions  Question syncopal episode Unclear if she may have had syncopal episode based on story from family. Although patient felt like she was awake the whole time. At risk of arrhythmia post CABG, recent history of SVT noted postop.  EKG with nonspecific ST/T changes - Continue  telemonitoring; she is borderline bradycardic. - Orthostatics - Limited echo to evaluate for effusion, complications post CABG  ?  Community-acquired pneumonia Postsurgical patient. Reports nonproductive cough x 2 days. On room air. WBC 8. Chest x-ray demonstrating small left base infiltrate and small left pleural effusion.  Considering risk for pneumonia and encephalopathy favor treating with antibiotic. - Check flu/COVID/RSV, sputum culture, procalcitonin - Start ceftriaxone  1 g IV every 24 hours, azithromycin  500 mg daily - Albuterol  prn, I-S, encourage out of bed  Recent history of CABG X2 Acute myocardial injury - resolved.  CAD with LAD PCI 3/'25 and recent history of CABG X2 LIMA to LAD, SVG to OM1 done at [facility].  She had very minimal elevation in high-sensitivity troponin just above reference range at 18 which downtrended to normal.  Nonspecific ST-T changes on EKG.  Doubt any complication post CABG. - EDP had discussed with our cardiothoracic team Dr. Erik, and they were okay examining postoperatively. 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    DVT prophylaxis:  SCDs Code Status:  Full Code Diet:  Diet Orders (From admission, onward)     Start     Ordered   06/11/24 2103  Diet  NPO time specified  Diet effective now        06/11/24 2102           Family Communication:  None   Consults:  Cardiology   Admission status:   Observation, Telemetry bed  Severity of Illness: The appropriate patient status for this patient is OBSERVATION. Observation status is judged to be reasonable and necessary in order to provide the required intensity of service to ensure the patient's safety. The patient's presenting symptoms, physical exam findings, and initial radiographic and laboratory data in the context of their medical condition is felt to place them at decreased risk for further clinical deterioration. Furthermore, it is anticipated that the patient will be medically stable for discharge from the hospital within 2 midnights of admission.    Dorn Dawson, MD [facility]  How to contact the [facility] Attending or Consulting provider 7A - 7P or covering provider during after hours 7P -7A, for this patient.  Check the care team in [facility] and look for a) attending/consulting [facility] provider listed and b) the [facility] team listed Log into [email or web address] and use [place]'s universal password to access. If you do not have the password, please contact the hospital operator. Locate the [facility] provider you are looking for under [facility] and page to a number that you can be directly reached. If you still have difficulty reaching the provider, please page the [facility] (Director on Call) for the Hospitalists listed on amion for assistance.  06/12/2024, 2:29 AM

## 2024-06-12 NOTE — Progress Notes (Addendum)
 NEUROLOGY CONSULT FOLLOW UP NOTE   Date of service: June 12, 2024 Patient Name: Rachel Mitchell MRN:  [id number] DOB:  March 27, 1951  Interval Hx/subjective   Patient states she is feeling better.  She states her weakness has improved.  She does not have any other concerns this morning.  Does endorse that recently her medication regimen has changed greatly.  Vitals   Vitals:   06/12/24 0830 06/12/24 0845 06/12/24 0900 06/12/24 0907  BP: (!) 134/92  (!) 169/54   Pulse: 74 73 79 71  Resp: (!) 25 12  18   Temp:    (!) 97.5 F (36.4 C)  TempSrc:    Oral  SpO2: 98% 97% 99% 96%  Weight:         Body mass index is 26.47 kg/m.  Physical Exam   Constitutional: Appears well-developed and well-nourished Psych: Affect appropriate to situation Eyes: No scleral injection.  HENT: No OP obstrucion.  Head: Normocephalic.  Respiratory: Effort normal, non-labored breathing.  Neurologic Examination   Neuro: *MS: A&O x4. Follows multi-step commands.  *Speech: no dysarthria or aphasia, able to name and repeat. *CN:    I: Deferred   II,III: PERRLA   III,IV,VI: EOMI w/o nystagmus, no ptosis   V: Sensation intact from V1 to V3 to LT   VII: Eyelid closure was full.  Smile symmetric.   VIII: Hearing intact to voice   IX,X: Voice normal, palate elevates symmetrically    XI: SCM/trap 5/5 bilat   XII: Tongue protrudes midline, no atrophy or fasciculations  *Motor:   Normal bulk.  No tremor, rigidity or bradykinesia. No pronator drift.   Strength: Dlt Bic Tri WE WrF FgS Gr HF KnF KnE PlF DoF    Left 5 5 5 5 5 5 5 5 5 5 5 5     Right 5 5 5 5 5 5 5 5 5 5 5 5    *Sensory: Slightly decreased sensation noted to left lower extremity on medial aspect and less sensation noted on lateral aspect of right lower extremity when compared to left. *Reflexes:  3+ and symmetric to patellar reflexes, otherwise all other reflexes 2+ and symmetric *Gait: deferred  Medications  Current Facility-Administered  Medications:    acetaminophen  (TYLENOL ) tablet 1,000 mg, 1,000 mg, Oral, Q6H PRN, Segars, Jonathan, MD   albuterol  (PROVENTIL ) (2.5 MG/3ML) 0.083% nebulizer solution 2.5 mg, 2.5 mg, Nebulization, Q4H PRN, Segars, Jonathan, MD   azithromycin  (ZITHROMAX ) tablet 500 mg, 500 mg, Oral, Daily, Segars, Dorn, MD, 500 mg at 06/12/24 0402   cefTRIAXone  (ROCEPHIN ) 1 g in sodium chloride  0.9 % 100 mL IVPB, 1 g, Intravenous, Q24H, Segars, Dorn, MD, Stopped at 06/12/24 0432   clonazePAM  (KLONOPIN ) tablet 0.5 mg, 0.5 mg, Oral, BID PRN, Segars, Jonathan, MD   DULoxetine  (CYMBALTA ) DR capsule 30 mg, 30 mg, Oral, Daily, Segars, Dorn, MD   gabapentin  (NEURONTIN ) capsule 300 mg, 300 mg, Oral, TID, Segars, Dorn, MD   irbesartan  (AVAPRO ) tablet 300 mg, 300 mg, Oral, Daily, Segars, Dorn, MD   ivabradine  (CORLANOR) tablet 7.5 mg, 7.5 mg, Oral, BID WC, Segars, Dorn, MD   melatonin tablet 6 mg, 6 mg, Oral, QHS PRN, Segars, Jonathan, MD   methocarbamol  (ROBAXIN ) tablet 500 mg, 500 mg, Oral, Q8H PRN, Segars, Jonathan, MD   ondansetron  (ZOFRAN ) injection 4 mg, 4 mg, Intravenous, Q6H PRN, Segars, Jonathan, MD   oxyCODONE  (Oxy IR/ROXICODONE ) immediate release tablet 2.5 mg, 2.5 mg, Oral, Q4H PRN, Segars, Jonathan, MD   polyethylene glycol (MIRALAX  / GLYCOLAX ) packet  17 g, 17 g, Oral, Daily PRN, Segars, Jonathan, MD   sodium chloride  flush (NS) 0.9 % injection 3 mL, 3 mL, Intravenous, Q12H, Segars, Dorn, MD  Current Outpatient Medications:    clonazePAM  (KLONOPIN ) 1 MG tablet, Take 1 mg by mouth 2 (two) times daily as needed., Disp: , Rfl:    DULoxetine  (CYMBALTA ) 30 MG capsule, Take 30 mg by mouth daily., Disp: , Rfl:    estrogens, conjugated, (PREMARIN) 0.3 MG tablet, Take by mouth., Disp: , Rfl:    gabapentin  (NEURONTIN ) 300 MG capsule, Take by mouth., Disp: , Rfl:    loratadine (CLARITIN) 10 MG tablet, Take by mouth., Disp: , Rfl:    metoprolol  succinate (TOPROL -XL) 25 MG 24 hr tablet, Take 25  mg by mouth daily., Disp: , Rfl:    Multiple Vitamin (MULTI-VITAMIN) tablet, Take 1 tablet by mouth daily., Disp: , Rfl:    olmesartan (BENICAR) 40 MG tablet, Take by mouth., Disp: , Rfl:    omeprazole (PRILOSEC) 40 MG capsule, Take by mouth., Disp: , Rfl:    ondansetron  (ZOFRAN  ODT) 4 MG disintegrating tablet, Take 1 tablet (4 mg total) by mouth every 8 (eight) hours as needed for nausea or vomiting., Disp: 20 tablet, Rfl: 0   pravastatin  (PRAVACHOL ) 40 MG tablet, Take 40 mg by mouth daily., Disp: , Rfl:    traMADol (ULTRAM) 50 MG tablet, tramadol 50 mg tablet  Take 1 tablet every 4-6 hours by oral route as needed., Disp: , Rfl:    verapamil  (CALAN -SR) 180 MG CR tablet, Take by mouth., Disp: , Rfl:    VERAPAMIL  HCL PO, Take 180 mg by mouth in the morning and at bedtime., Disp: , Rfl:    zolpidem  (AMBIEN ) 10 MG tablet, Take by mouth., Disp: , Rfl:   Labs and Diagnostic Imaging   CBC:  Recent Labs  Lab 06/11/24 2100 06/11/24 2107 06/11/24 2230 06/12/24 0447  WBC 8.9  --   --  7.1  NEUTROABS 5.5  --   --   --   HGB 9.3*   < > 8.8* 9.5*  HCT 30.3*   < > 26.0* 30.6*  MCV 95.9  --   --  92.4  PLT 459*  --   --  477*   < > = values in this interval not displayed.    Basic Metabolic Panel:  Lab Results  Component Value Date   NA 138 06/12/2024   K 4.1 06/12/2024   CO2 19 (L) 06/12/2024   GLUCOSE 170 (H) 06/12/2024   BUN 11 06/12/2024   CREATININE 0.96 06/12/2024   CALCIUM  9.2 06/12/2024   GFRNONAA >60 06/12/2024   Lipid Panel: No results found for: LDLCALC HgbA1c: No results found for: HGBA1C Urine Drug Screen:     Component Value Date/Time   LABOPIA NONE DETECTED 10/10/2020 2110   COCAINSCRNUR NONE DETECTED 10/10/2020 2110   LABBENZ NONE DETECTED 10/10/2020 2110   AMPHETMU NONE DETECTED 10/10/2020 2110   THCU NONE DETECTED 10/10/2020 2110   LABBARB NONE DETECTED 10/10/2020 2110    Alcohol Level     Component Value Date/Time   ETH <15 06/11/2024 2100   INR  Lab  Results  Component Value Date   INR 1.1 06/11/2024   APTT  Lab Results  Component Value Date   APTT 32 06/11/2024   AED levels: No results found for: PHENYTOIN, ZONISAMIDE, LAMOTRIGINE, LEVETIRACETA  CT Head without contrast(Personally reviewed): IMPRESSION: 1. No acute intracranial abnormality. ASPECTS is 10.  CT angio Head and  Neck with contrast(Personally reviewed): IMPRESSION: 1. No acute findings. 2. 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 3. Unchanged small meningioma anterior to the left temporal lobe.  MRI Brain(Personally reviewed): IMPRESSION: 1. No acute intracranial abnormality. 2. Multifocal hyperintense T2-weighted signal within the cerebral white matter, most commonly due to chronic small vessel disease.   Assessment   Rachel Mitchell is a 73 y.o. female Rachel Mitchell is a 73 y.o. female past history of hypertension, CAD with recent CABG done at an outside facility presenting for evaluation of generalized lethargy and on scene by EMS noted to have aphasia and left-sided weakness.  The patient is much more improved this morning.  This could have been very well related to polypharmacy.  MRI ruled out stroke.  Recommendations  - Patient stable to discharge from a neurology standpoint - No acute stroke - Primary team to address polypharmacy - Agree with physical therapy and Occupational Therapy recommendations ______________________________________________________________________   Signed, Libby Blanch, DO Internal Medicine Resident PGY 3  NEUROHOSPITALIST ADDENDUM Performed a face to face diagnostic evaluation.   I have reviewed the contents of history and physical exam as documented by PA/ARNP/Resident and agree with above documentation.  I have discussed and formulated the above plan as documented. Edits to the note have been  made as needed.  Karris Deangelo, MD [facility]
